# Patient Record
Sex: Male | Born: 1970 | Race: White | Hispanic: No | Marital: Married | Smoking: Former smoker
Health system: Southern US, Community
[De-identification: ages and names within clinical notes are randomized; demographics above are authoritative.]

## PROBLEM LIST (undated history)

## (undated) DIAGNOSIS — K900 Celiac disease: Secondary | ICD-10-CM

## (undated) DIAGNOSIS — E785 Hyperlipidemia, unspecified: Secondary | ICD-10-CM

## (undated) DIAGNOSIS — I341 Nonrheumatic mitral (valve) prolapse: Secondary | ICD-10-CM

## (undated) DIAGNOSIS — I10 Essential (primary) hypertension: Secondary | ICD-10-CM

## (undated) DIAGNOSIS — K219 Gastro-esophageal reflux disease without esophagitis: Secondary | ICD-10-CM

## (undated) HISTORY — DX: Essential (primary) hypertension: I10

## (undated) HISTORY — DX: Hyperlipidemia, unspecified: E78.5

## (undated) HISTORY — PX: SPINAL CORD STIMULATOR INSERTION: SHX5378

## (undated) HISTORY — PX: KNEE ARTHROSCOPY: SHX127

## (undated) HISTORY — PX: TONSILLECTOMY: SUR1361

---

## 1998-02-12 ENCOUNTER — Ambulatory Visit (HOSPITAL_COMMUNITY): Admission: RE | Admit: 1998-02-12 | Discharge: 1998-02-12 | Payer: Self-pay | Admitting: Neurology

## 1998-02-18 ENCOUNTER — Ambulatory Visit (HOSPITAL_COMMUNITY): Admission: RE | Admit: 1998-02-18 | Discharge: 1998-02-18 | Payer: Self-pay | Admitting: Neurology

## 1998-02-20 ENCOUNTER — Ambulatory Visit (HOSPITAL_COMMUNITY): Admission: RE | Admit: 1998-02-20 | Discharge: 1998-02-20 | Payer: Self-pay | Admitting: Neurology

## 1999-11-12 ENCOUNTER — Encounter: Payer: Self-pay | Admitting: Emergency Medicine

## 1999-11-12 ENCOUNTER — Emergency Department (HOSPITAL_COMMUNITY): Admission: EM | Admit: 1999-11-12 | Discharge: 1999-11-12 | Payer: Self-pay | Admitting: Emergency Medicine

## 2000-11-16 ENCOUNTER — Encounter: Payer: Self-pay | Admitting: Orthopedic Surgery

## 2000-11-16 ENCOUNTER — Ambulatory Visit (HOSPITAL_COMMUNITY): Admission: RE | Admit: 2000-11-16 | Discharge: 2000-11-16 | Payer: Self-pay | Admitting: Orthopedic Surgery

## 2001-09-08 ENCOUNTER — Encounter: Admission: RE | Admit: 2001-09-08 | Discharge: 2001-09-08 | Payer: Self-pay | Admitting: Urology

## 2001-09-08 ENCOUNTER — Encounter: Payer: Self-pay | Admitting: Urology

## 2004-06-09 ENCOUNTER — Emergency Department (HOSPITAL_COMMUNITY): Admission: EM | Admit: 2004-06-09 | Discharge: 2004-06-09 | Payer: Self-pay | Admitting: Emergency Medicine

## 2006-03-24 ENCOUNTER — Ambulatory Visit (HOSPITAL_BASED_OUTPATIENT_CLINIC_OR_DEPARTMENT_OTHER): Admission: RE | Admit: 2006-03-24 | Discharge: 2006-03-24 | Payer: Self-pay | Admitting: Orthopedic Surgery

## 2007-04-05 HISTORY — PX: BACK SURGERY: SHX140

## 2007-04-12 ENCOUNTER — Emergency Department (HOSPITAL_COMMUNITY): Admission: EM | Admit: 2007-04-12 | Discharge: 2007-04-12 | Payer: Self-pay | Admitting: Emergency Medicine

## 2007-05-02 ENCOUNTER — Encounter (INDEPENDENT_AMBULATORY_CARE_PROVIDER_SITE_OTHER): Payer: Self-pay | Admitting: Orthopedic Surgery

## 2007-05-02 ENCOUNTER — Inpatient Hospital Stay (HOSPITAL_COMMUNITY): Admission: RE | Admit: 2007-05-02 | Discharge: 2007-05-06 | Payer: Self-pay | Admitting: Orthopedic Surgery

## 2008-05-24 ENCOUNTER — Ambulatory Visit (HOSPITAL_BASED_OUTPATIENT_CLINIC_OR_DEPARTMENT_OTHER): Admission: RE | Admit: 2008-05-24 | Discharge: 2008-05-24 | Payer: Self-pay | Admitting: Family Medicine

## 2008-06-06 ENCOUNTER — Emergency Department (HOSPITAL_BASED_OUTPATIENT_CLINIC_OR_DEPARTMENT_OTHER): Admission: EM | Admit: 2008-06-06 | Discharge: 2008-06-06 | Payer: Self-pay | Admitting: Emergency Medicine

## 2008-12-18 ENCOUNTER — Ambulatory Visit: Payer: Self-pay | Admitting: Diagnostic Radiology

## 2008-12-18 ENCOUNTER — Emergency Department (HOSPITAL_BASED_OUTPATIENT_CLINIC_OR_DEPARTMENT_OTHER): Admission: EM | Admit: 2008-12-18 | Discharge: 2008-12-18 | Payer: Self-pay | Admitting: Emergency Medicine

## 2009-06-06 ENCOUNTER — Ambulatory Visit (HOSPITAL_COMMUNITY): Admission: RE | Admit: 2009-06-06 | Discharge: 2009-06-06 | Payer: Self-pay | Admitting: Gastroenterology

## 2009-07-14 ENCOUNTER — Encounter: Admission: RE | Admit: 2009-07-14 | Discharge: 2009-10-12 | Payer: Self-pay | Admitting: Gastroenterology

## 2009-09-03 ENCOUNTER — Encounter: Admission: RE | Admit: 2009-09-03 | Discharge: 2009-09-03 | Payer: Self-pay | Admitting: Orthopedic Surgery

## 2009-09-08 ENCOUNTER — Encounter: Admission: RE | Admit: 2009-09-08 | Discharge: 2009-09-08 | Payer: Self-pay | Admitting: Orthopedic Surgery

## 2010-07-26 ENCOUNTER — Encounter: Payer: Self-pay | Admitting: Orthopedic Surgery

## 2010-10-12 LAB — POCT CARDIAC MARKERS
CKMB, poc: <1 ng/mL — ABNORMAL LOW (ref 1.0–8.0)
CKMB, poc: <1 ng/mL — ABNORMAL LOW (ref 1.0–8.0)
Myoglobin, poc: 28.6 ng/mL (ref 12–200)
Myoglobin, poc: 29.5 ng/mL (ref 12–200)
Troponin i, poc: <0.05 ng/mL (ref 0.00–0.09)
Troponin i, poc: <0.05 ng/mL (ref 0.00–0.09)

## 2010-10-12 LAB — BASIC METABOLIC PANEL
BUN: 13 mg/dL (ref 6–23)
CO2: 30 mEq/L (ref 19–32)
Calcium: 8.6 mg/dL (ref 8.4–10.5)
Chloride: 104 mEq/L (ref 96–112)
Creatinine, Ser: 1 mg/dL (ref 0.4–1.5)
GFR calc Af Amer: >60 mL/min (ref >60)
GFR calc non Af Amer: >60 mL/min (ref >60)
Glucose, Bld: 94 mg/dL (ref 70–99)
Potassium: 3.8 mEq/L (ref 3.5–5.1)
Sodium: 142 mEq/L (ref 135–145)

## 2010-10-12 LAB — CBC
HCT: 39.1 % (ref 39.0–52.0)
Hemoglobin: 13 g/dL (ref 13.0–17.0)
MCHC: 33.3 g/dL (ref 30.0–36.0)
MCV: 85.8 fL (ref 78.0–100.0)
Platelets: 314 10*3/uL (ref 150–400)
RBC: 4.56 MIL/uL (ref 4.22–5.81)
RDW: 14.3 % (ref 11.5–15.5)
WBC: 9 10*3/uL (ref 4.0–10.5)

## 2010-11-17 NOTE — Discharge Summary (Signed)
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   HOSPITAL COURSE:  The patient was admitted and taken to the operating  room on the day of admission for management of axial pain and right leg  pain secondary to an L4-5 disk degeneration with annular tear, L5-S1  anomalous segmentation with disk degeneration.  On the day of admission,  he was taken to the operating room anterior antibody fusion was carried  out at L4-5 and L5-S1.  Postoperatively, in the recovery room he  complained of inability to move his left leg, decreased sensation in the  lower leg, and a heavy feeling in the leg.  He had markedly reduced  muscle strength in all muscle groups tested on manual resistance.  The  reason for this was completely unclear as there had been no  intraoperative complications which would cause this type of situation.  For this reason, he is taken from the recovery room to the radiology  department with Dr. Lacy Duverney, performed a myelogram with CT scan.  This  myelogram and CT scan were in fact normal.  There was no suggestion of  any underlying cause associated with nerve root compression etcetera.  Fortunately, over the course of approximately the next 36 hours,  symptoms virtually improved and resolved with full strength returning to  his lower extremity.   Otherwise, his course was uncomplicated.  He was able to void  voluntarily subsequent to the removal of the Foley catheter.  His ileus  resolved in a usual time frame and he was able to tolerate oral intake  satisfactorily.   At the time of discharge, he is ambulatory and he is using a walker for  longer distances.  His incisions looked fine this morning.  Dressings  had been removed.   DISCHARGE MEDICATIONS:  He is being given discharge medications,  prescriptions written by Lianne Cure, P.A.C., as follows:  1. Norco 10 one to two q.4 hours p.r.n., 120 tablets.  2. Robaxin 500 mg 1 to 2 q.6 hours for muscle spasm, 100 tablets.  3. Coumadin 3 mg, which he will initiate at 3 mg per day until further      notice.   He has been placed on prophylactic anticoagulation because of a good  deal of retraction to common iliac vessels during his surgery.  His INR  Pro Time will be checked twice a week until his Coumadin dose is felt to  be stable.   Today his INR/Pro Time result is pending.  If the result is such that I  feel his dose needs to be adjusted upward or downward from 3 mg, I have  his  phone number and will call him.   He is to return to the office in approximately 4 weeks time.  He is to  avoid bending and lifting.  His postoperative physical is walking.   CONDITION ON DISCHARGE:  Improved back pain, ambulatory.  Wound stable.   See above for discharge medications and instructions and followup.      Nelda Severe, MD  Electronically Signed     MT/MEDQ  D:  05/06/2007  T:  05/07/2007  Job:  [id number]

## 2010-11-17 NOTE — Op Note (Signed)
Paul Roman, Paul Roman               ACCOUNT NO.:  [id number]   MEDICAL RECORD NO.:  [id number]          PATIENT TYPE:  INP   LOCATION:  2550                         FACILITY:  [facility]   PHYSICIAN:  Nelda Severe, MD      DATE OF BIRTH:  February 25, 1971   DATE OF PROCEDURE:  DATE OF DISCHARGE:                               OPERATIVE REPORT   SURGEON:  Nelda Severe, MD.   ASSISTANT:  Lianne Cure, PA-C.   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   OPERATIVE PROCEDURE:  Anterior interbody fusion at L5-S1 (Titan cage  with autogenous bone graft), L4-5 anterior interbody fusion Synfix cage  and internal fixation device with autogenous iliac crest bone graft,  harvest left anterior iliac crest graft.   OPERATIVE NOTE:  The patient was placed under general endotracheal  anesthesia.  Prior to induction of anesthesia a central line and  arterial line had been started.  After anesthesia was induced, Foley  catheter was placed in the bladder.  Bilateral sequential compression  devices were placed on both lower extremities.  A pulse oximeter was  placed on the left great toe.   The patient was placed supine on the operating table.    Dictation ended at this point.      Nelda Severe, MD  Electronically Signed     MT/MEDQ  D:  05/02/2007  T:  05/02/2007  Job:  [id number]

## 2010-11-17 NOTE — Op Note (Signed)
NAMEKHADEN, GATER               ACCOUNT NO.:  [id number]   MEDICAL RECORD NO.:  [id number]          PATIENT TYPE:  INP   LOCATION:  2550                         FACILITY:  [facility]   PHYSICIAN:  Nelda Severe, MD      DATE OF BIRTH:  July 08, 1970   DATE OF PROCEDURE:  05/02/2007  DATE OF DISCHARGE:                               OPERATIVE REPORT   SURGEON:  Nelda Severe, M.D.   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   POSTOPERATIVE DIAGNOSIS:  L4-5 annular tear, L5-S1 anomalous  segmentation with disk degeneration.   OPERATIVE PROCEDURE:  Anterior interbody body fusions at L4-5 (Synthes  SynFix cage with internal fixation device and autogenous bone graft );  L5-S1 anterior interbody fusion (Titan interbody cage with autogenous  bone graft and doorstop screw); harvest left anterior iliac crest graft.   OPERATIVE NOTE:  In the preoperative area the patient had a central and  arterial line placed by the anesthesiologists.  He was then brought to  the operating room where general endotracheal anesthesia was induced.  A  Foley catheter was placed in the bladder.  A pulse oximeter device was  placed on the left great toe.  Sequential compression devices were  placed on both lower extremities.  Some hair was clipped from the  abdomen and suprapubic area.  The patient was positioned prone on a  Jackson table.  The arms were abducted at the sides on arm boards.  The  anterior abdominal area was prepped with DuraPrep and draped in  rectangular fashion.  The drapes were secured with Ioban.  The field  included the left anterior iliac crest area.   Once the time out had been performed we began the procedure by making  incision just distal to the anterior iliac crest on the left side  approximately 5 cm posterior to the anterior superior iliac spine.  Dissection was carried down through the external oblique fascia onto the  top of  the iliac crest which was exposed with elevators.   We then used a disposable bone graft harvesting device to remove several  cores of bone from the medullary cavity of the iliac crest.  More bone  was harvested with a curette.  The defect in the bone was then packed  with Gelfoam.   We then made a left lower abdominal paramedian incision.  The rectus  sheath was incised in line with the incision.  The medial edge of the  rectus was mobilized laterally.  The arcuate line was defined.  A sharp  scalpel was used to incise the posterior rectus sheath above the arcuate  line and then this was bluntly dissected off the peritoneum.  We then  bluntly dissected through the preperitoneal fat distal to the arcuate  line into the retroperitoneal space and mobilized the peritoneal sac  from left to right.  The psoas muscle, iliac vessels and ureter were all  identified.  The ureter was retracted to the right side with the  peritoneum.  The prominent disk was identified in the bifurcation  of the  great vessels and a crosstable lateral fluoro view taken which showed  this to be L4-5, and I had actually expected I was going to be at L5-S1.  We then further bluntly dissected distally, being careful to start the  dissection from the left side, working to the right to expose the  anterior annulus of L5-S1.  In the course of identifying this interval,  it became evident that the vascular was somewhat anomalous.  Firstly,  the bifurcation was very much higher than usual.  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  This vein was ligated with 3-0 silk and  divided.  This permitted further exposure of the anterior disk space at  L5-S1 which was confirmed with lateral fluoroscopic view.  Self-  retaining table mounted retractors were then placed to retract the  iliac  vessels bilaterally and a self-retaining retractor used to retract the  peritoneum proximally.  A small lap sponge was packed in the presacral  area and later removed.   We then did an extensive disk excision at L5-S1, removing approximately  90% of the disk tissue in the usual fashion.  First we fenestrated the  annulus and then separated the nucleus from the endplates of L5-S1 and  used a curette to further divide the tissue laterally on both sides.  Further preparation of the disk space was then carried out with special  broaches with the [facility] system.  We were actually able to broach up to  an 11 mm thickness, but with a small footprint.  I tried the next size  up footprint and it would not seat adequately.   Therefore, the small footprint 11 mm Titan implant was packed with  autogenous bone graft and then impacted into place under fluoroscopic  guidance.  A doorstop screw (30 mm cancellous screw with washer,  titanium) was inserted into S1 and checked for position with lateral  fluoroscopy.   I had initially thought that the common iliac vein's origin from the  vena cava was low enough that it would be impossible to expose the L4-5  disk (which we had identified initially) through the bifurcation.  I  therefore had developed the usual interval lateral to the common iliac  artery and vein and medial to the psoas muscle using blunt dissection.  We did identify and ligate and divide segmental vessels at the L4 level  above and iliofemoral tributary distally.  I then began mobilization of  the common iliac vein and got it past the midline, but did not seem to  move it any farther without placing undue tension on it.  I then exposed  it more medially in the bifurcation and bluntly dissected proximally  along the left common iliac vein to the bifurcation of the vena cava.  We then mobilized the common iliac vein on the right to the right side,  and on the left to the left side,  and this satisfactorily exposed the  disk.  Self-retaining table mounted retractors were then placed to hold  the veins and arteries.  During this time the pulse oximeter began to  alarm as is the usual case when the artery is retracted.  It was turned  off, but later turned on when retraction was released and oxygen  saturation did return to 100%.  We then prepared the disk at L4-5 in a similar fashion to that described  at L5-S1, except that this was for a SynFix implant.  This SynFix  implant was being used at L4-5, but not at L5-S1 because I felt that the  size of the implant was such that it was not going to fit well in the L5-  S1 disk.   Having enucleated the disk and separated the nucleus from the endplates,  and having curetted the endplates, smallest available implant sizer was  inserted at L4-5 and fit nicely.  We had to harvest a little more bone  graft to adequately pack the SynFix device.  This was easily done as I  had not closed the iliac crest wound and merely curetted a little more  bone from between the outer and inner tables of the ilium.  Having  packed the implant with autogenous bone from the iliac crest, it was  then inserted using the disk with squid inserter device.  Four screws  were then placed through the internal fixation device, 2 above into L4  and 2 below into L5.  We then took lateral fluoroscopic and AP  fluoroscopic views which were recorded and showed satisfactory position  of all implants.   The retractors were all removed prior to the taking of the fluoroscopic  views.  I then inspected the wound again, retracting the peritoneal sac  with my hand, and there was no bleeding identifiable.   The anterior rectus sheath was then closed using a continuous #1 Vicryl  suture.  The subcutaneous tissue was closed using 2-0 undyed Vicryl in  simple inverted sutures.  Skin layer was closed using subcuticular 3-0  undyed Vicryl in continuous fashion.  The  skin edges were reinforced  with Steri-Strips and a nonadherent dressing applied.  The iliac crest  wound was closed in similar fashion, using an O Vicryl in the external  oblique fascia, 2-0 Vicryl in the subcutaneous tissue and a subcuticular  3-0 undyed running suture in the skin.  Steri-Strips were applied and a  nonadherent dressing applied.   Blood loss estimated at 400 mL.  The patient returned to the recovery  room in satisfactory condition.  He was able to actively plantar flex  and dorsiflex both feet and ankles.  As noted oxygen saturation returned  to 100%.   There no intraoperative complications.  Sponge and needle counts were  correct.      Nelda Severe, MD  Electronically Signed     MT/MEDQ  D:  05/02/2007  T:  05/02/2007  Job:  [id number]

## 2011-04-01 ENCOUNTER — Emergency Department (INDEPENDENT_AMBULATORY_CARE_PROVIDER_SITE_OTHER): Payer: BC Managed Care – PPO

## 2011-04-01 ENCOUNTER — Encounter: Payer: Self-pay | Admitting: *Deleted

## 2011-04-01 ENCOUNTER — Emergency Department (HOSPITAL_BASED_OUTPATIENT_CLINIC_OR_DEPARTMENT_OTHER)
Admission: EM | Admit: 2011-04-01 | Discharge: 2011-04-01 | Disposition: A | Discharge status: Home / Self Care | Payer: BC Managed Care – PPO | Attending: Emergency Medicine | Admitting: Emergency Medicine

## 2011-04-01 DIAGNOSIS — R509 Fever, unspecified: Secondary | ICD-10-CM | POA: Insufficient documentation

## 2011-04-01 DIAGNOSIS — R52 Pain, unspecified: Secondary | ICD-10-CM

## 2011-04-01 DIAGNOSIS — R7989 Other specified abnormal findings of blood chemistry: Secondary | ICD-10-CM | POA: Insufficient documentation

## 2011-04-01 DIAGNOSIS — R1031 Right lower quadrant pain: Secondary | ICD-10-CM | POA: Insufficient documentation

## 2011-04-01 DIAGNOSIS — I708 Atherosclerosis of other arteries: Secondary | ICD-10-CM

## 2011-04-01 DIAGNOSIS — R319 Hematuria, unspecified: Secondary | ICD-10-CM | POA: Insufficient documentation

## 2011-04-01 DIAGNOSIS — R300 Dysuria: Secondary | ICD-10-CM | POA: Insufficient documentation

## 2011-04-01 DIAGNOSIS — R110 Nausea: Secondary | ICD-10-CM | POA: Insufficient documentation

## 2011-04-01 HISTORY — DX: Celiac disease: K90.0

## 2011-04-01 LAB — DIFFERENTIAL
Basophils Absolute: 0 10*3/uL (ref 0.0–0.1)
Basophils Relative: 0 % (ref 0–1)
Eosinophils Absolute: 0 10*3/uL (ref 0.0–0.7)
Eosinophils Relative: 0 % (ref 0–5)
Lymphocytes Relative: 9 % — ABNORMAL LOW (ref 12–46)
Lymphs Abs: 1.5 10*3/uL (ref 0.7–4.0)
Monocytes Absolute: 1.3 10*3/uL — ABNORMAL HIGH (ref 0.1–1.0)
Monocytes Relative: 8 % (ref 3–12)
Neutro Abs: 13.3 10*3/uL — ABNORMAL HIGH (ref 1.7–7.7)
Neutrophils Relative %: 83 % — ABNORMAL HIGH (ref 43–77)

## 2011-04-01 LAB — COMPREHENSIVE METABOLIC PANEL
ALT: 76 U/L — ABNORMAL HIGH (ref 0–53)
AST: 102 U/L — ABNORMAL HIGH (ref 0–37)
Albumin: 3.3 g/dL — ABNORMAL LOW (ref 3.5–5.2)
Alkaline Phosphatase: 98 U/L (ref 39–117)
BUN: 10 mg/dL (ref 6–23)
CO2: 25 mEq/L (ref 19–32)
Calcium: 8.9 mg/dL (ref 8.4–10.5)
Chloride: 98 mEq/L (ref 96–112)
Creatinine, Ser: 0.9 mg/dL (ref 0.50–1.35)
GFR calc Af Amer: >60 mL/min (ref >60)
GFR calc non Af Amer: >60 mL/min (ref >60)
Glucose, Bld: 124 mg/dL — ABNORMAL HIGH (ref 70–99)
Potassium: 3.9 mEq/L (ref 3.5–5.1)
Sodium: 133 mEq/L — ABNORMAL LOW (ref 135–145)
Total Bilirubin: 0.5 mg/dL (ref 0.3–1.2)
Total Protein: 7 g/dL (ref 6.0–8.3)

## 2011-04-01 LAB — CBC
HCT: 42.4 % (ref 39.0–52.0)
Hemoglobin: 14.2 g/dL (ref 13.0–17.0)
MCH: 29.6 pg (ref 26.0–34.0)
MCHC: 33.5 g/dL (ref 30.0–36.0)
MCV: 88.5 fL (ref 78.0–100.0)
Platelets: 224 10*3/uL (ref 150–400)
RBC: 4.79 MIL/uL (ref 4.22–5.81)
RDW: 12.4 % (ref 11.5–15.5)
WBC: 16 10*3/uL — ABNORMAL HIGH (ref 4.0–10.5)

## 2011-04-01 LAB — URINALYSIS, ROUTINE W REFLEX MICROSCOPIC
Bilirubin Urine: NEGATIVE
Glucose, UA: NEGATIVE mg/dL
Ketones, ur: NEGATIVE mg/dL
Leukocytes, UA: NEGATIVE
Nitrite: NEGATIVE
Protein, ur: 30 mg/dL — AB
Specific Gravity, Urine: 1.018 (ref 1.005–1.030)
Urobilinogen, UA: 1 mg/dL (ref 0.0–1.0)
pH: 6 (ref 5.0–8.0)

## 2011-04-01 LAB — URINE MICROSCOPIC-ADD ON

## 2011-04-01 LAB — RAPID STREP SCREEN (MED CTR MEBANE ONLY): Streptococcus, Group A Screen (Direct): NEGATIVE

## 2011-04-01 MED ORDER — SODIUM CHLORIDE 0.9 % IV BOLUS (SEPSIS)
1000.0000 mL | Freq: Once | INTRAVENOUS | Status: AC
  Administered 2011-04-01: 1000 mL via INTRAVENOUS

## 2011-04-01 MED ORDER — IOHEXOL 300 MG/ML  SOLN
100.0000 mL | Freq: Once | INTRAMUSCULAR | Status: AC | PRN
  Administered 2011-04-01: 100 mL via INTRAVENOUS

## 2011-04-01 NOTE — ED Provider Notes (Signed)
History     CSN: [id number] Arrival date & time: 04/01/2011  1:46 PM  Chief Complaint  Patient presents with  . Generalized Body Aches    (Consider location/radiation/quality/duration/timing/severity/associated sxs/prior treatment) HPI Comments: Pt state that he is just feeling bad over the last couple days, fever, nausea, 2 episodes of vomiting, headache sorethroat, cough and hematuria:pt states that he was seen by his doctor yesterday and diagnosed with prostatitis:pt states that he has had 2 doses of medication and he is not feeling any better and when he called the office he was told to come to the er to be evaluated  Patient is a 40 y.o. male presenting with dysuria. The history is provided by the patient. No language interpreter was used.  Dysuria  This is a new problem. The current episode started 2 days ago. The problem occurs every urination. The pain is moderate. The maximum temperature recorded prior to his arrival was 101 to 101.9 F. The fever has been present for 1 to 2 days. He is sexually active. Associated symptoms include nausea and hematuria. He has tried antibiotics for the symptoms.    Past Medical History  Diagnosis Date  . Celiac disease     Past Surgical History  Procedure Date  . Back surgery   . Knee arthroscopy     No family history on file.  History  Substance Use Topics  . Smoking status: Not on file  . Smokeless tobacco: Not on file  . Alcohol Use:       Review of Systems  Gastrointestinal: Positive for nausea.  Genitourinary: Positive for dysuria and hematuria.  All other systems reviewed and are negative.    Allergies  Penicillins; Sulfa antibiotics; and Singulair  Home Medications   Current Outpatient Rx  Name Route Sig Dispense Refill  . ALPRAZOLAM PO Oral Take by mouth.      Marland Kitchen CIPRO PO Oral Take by mouth.      . OXYCONTIN PO Oral Take by mouth.      . OXYCODONE HCL PO Oral Take by mouth.        BP 125/81  Pulse 89   Temp(Src) 99.8 F (37.7 C) (Oral)  Resp 18  Ht 5\' 11"  (1.803 m)  Wt 211 lb (95.709 kg)  BMI 29.43 kg/m2  SpO2 95%  Physical Exam  Nursing note and vitals reviewed. Constitutional: He is oriented to person, place, and time. He appears well-developed and well-nourished.  HENT:  Right Ear: External ear normal.  Left Ear: External ear normal.  Mouth/Throat: Oropharynx is clear and moist.  Neck: Normal range of motion. Neck supple. No Brudzinski's sign and no Kernig's sign noted.  Cardiovascular: Normal rate and regular rhythm.   Pulmonary/Chest: Effort normal and breath sounds normal.  Abdominal: Soft. Bowel sounds are normal.  Musculoskeletal: Normal range of motion.  Neurological: He is alert and oriented to person, place, and time.  Skin: Skin is warm and dry. No rash noted.  Psychiatric: He has a normal mood and affect.    ED Course  Procedures (including critical care time)  Labs Reviewed  URINALYSIS, ROUTINE W REFLEX MICROSCOPIC - Abnormal; Notable for the following:    Hgb urine dipstick TRACE (*)    Protein, ur 30 (*)    All other components within normal limits  COMPREHENSIVE METABOLIC PANEL - Abnormal; Notable for the following:    Sodium 133 (*)    Glucose, Bld 124 (*)    Albumin 3.3 (*)    AST  102 (*)    ALT 76 (*)    All other components within normal limits  CBC - Abnormal; Notable for the following:    WBC 16.0 (*)    All other components within normal limits  DIFFERENTIAL - Abnormal; Notable for the following:    Neutrophils Relative 83 (*)    Neutro Abs 13.3 (*)    Lymphocytes Relative 9 (*)    Monocytes Absolute 1.3 (*)    All other components within normal limits  URINE MICROSCOPIC-ADD ON - Abnormal; Notable for the following:    Bacteria, UA FEW (*)    All other components within normal limits  RAPID STREP SCREEN   Dg Chest 2 View  04/01/2011  *RADIOLOGY REPORT*  Clinical Data: Fever, body aches, vomiting  CHEST - 2 VIEW  Comparison: Chest x-ray  of 12/18/2008  Findings: The lungs are clear.  Mediastinal contours appear normal. The heart is within normal limits in size.  No bony abnormality is seen.  IMPRESSION: No active lung disease.  Original Report Authenticated By: Juline Patch, M.D.   Ct Abdomen Pelvis W Contrast  04/01/2011  *RADIOLOGY REPORT*  Clinical Data: Right lower quadrant abdominal pain.  Fever. History of celiac disease.  CT ABDOMEN AND PELVIS WITH CONTRAST 04/01/2011:  Technique:  Multidetector CT imaging of the abdomen and pelvis was performed following the standard protocol during bolus administration of intravenous contrast.  Contrast: OMNIPAQUE IOHEXOL 300 MG/ML IV.  Oral contrast was also administered.  Comparison: CT abdomen and pelvis 05/24/2008.  Findings: Geographic areas of severe hepatic steatosis, with other geographic areas of sparing; no significant focal hepatic parenchymal abnormality otherwise.  Normal appearing spleen, pancreas, adrenal glands, and kidneys.  Gallbladder unremarkable by CT.  No biliary ductal dilation.  Mild bilateral iliac artery atherosclerosis, unchanged.  Visceral arteries patent.  No significant lymphadenopathy.  Stomach, small bowel, and colon normal in appearance.  Normal appendix in the right upper pelvis.  No ascites.  Urinary bladder unremarkable.  Prostate gland and seminal vesicles normal for age.  Visualized lung bases clear.  Heart size normal.  Bone window images demonstrate prior L4-5 and L5-S1 anterior fusion without complicating features.  IMPRESSION:  1.  No acute abnormalities involving the abdomen or pelvis. 2.  Geographic areas of focal hepatic steatosis, with other geographic areas of sparing. 3.  Mild bilateral common iliac artery atherosclerosis, perhaps advanced for age.  Original Report Authenticated By: Arnell Sieving, M.D.     No diagnosis found.    MDM  No acute finding:pt has no meningeal signs at this time:pt is feeling slightly better with fluids:will  have pt continue cipro:discussed elevated lft with pt:pt to follow up with pcp tomorrow        Teressa Lower, NP 04/01/11 1721

## 2011-04-01 NOTE — ED Notes (Signed)
Pt. Was seen at [facility] at [place] on yesterday for prostate infection??? And today Pt. Reports feeling tired and weak with fever.  Pt. Reports he was urinating blood yesterday.  Pt. Reports the NP did a prostate exam on yesterday but Pt. Could hardly stand the exam.

## 2011-04-01 NOTE — ED Notes (Signed)
Pt. In no distress.

## 2011-04-02 NOTE — ED Provider Notes (Signed)
Medical screening examination/treatment/procedure(s) were performed by non-physician practitioner and as supervising physician I was immediately available for consultation/collaboration.   Dayton Bailiff, MD 04/02/11 1535

## 2011-04-09 LAB — DIFFERENTIAL
Basophils Absolute: 0 10*3/uL (ref 0.0–0.1)
Basophils Relative: 0 % (ref 0–1)
Eosinophils Absolute: 0.1 10*3/uL (ref 0.0–0.7)
Eosinophils Relative: 1 % (ref 0–5)
Lymphocytes Relative: 23 % (ref 12–46)
Lymphs Abs: 2.1 10*3/uL (ref 0.7–4.0)
Monocytes Absolute: 0.8 10*3/uL (ref 0.1–1.0)
Monocytes Relative: 9 % (ref 3–12)
Neutro Abs: 6.2 10*3/uL (ref 1.7–7.7)
Neutrophils Relative %: 67 % (ref 43–77)

## 2011-04-09 LAB — CBC
HCT: 38.5 % — ABNORMAL LOW (ref 39.0–52.0)
Hemoglobin: 12 g/dL — ABNORMAL LOW (ref 13.0–17.0)
MCHC: 31.2 g/dL (ref 30.0–36.0)
MCV: 76 fL — ABNORMAL LOW (ref 78.0–100.0)
Platelets: 385 10*3/uL (ref 150–400)
RBC: 5.07 MIL/uL (ref 4.22–5.81)
RDW: 15.4 % (ref 11.5–15.5)
WBC: 9.2 10*3/uL (ref 4.0–10.5)

## 2011-04-09 LAB — URINALYSIS, ROUTINE W REFLEX MICROSCOPIC
Bilirubin Urine: NEGATIVE
Glucose, UA: NEGATIVE mg/dL
Hgb urine dipstick: NEGATIVE
Ketones, ur: NEGATIVE mg/dL
Nitrite: NEGATIVE
Protein, ur: NEGATIVE mg/dL
Specific Gravity, Urine: 1.012 (ref 1.005–1.030)
Urobilinogen, UA: 0.2 mg/dL (ref 0.0–1.0)
pH: 6 (ref 5.0–8.0)

## 2011-04-09 LAB — BASIC METABOLIC PANEL
BUN: 6 mg/dL (ref 6–23)
CO2: 24 mEq/L (ref 19–32)
Calcium: 8.5 mg/dL (ref 8.4–10.5)
Chloride: 108 mEq/L (ref 96–112)
Creatinine, Ser: 0.7 mg/dL (ref 0.4–1.5)
GFR calc Af Amer: >60 mL/min (ref >60)
GFR calc non Af Amer: >60 mL/min (ref >60)
Glucose, Bld: 93 mg/dL (ref 70–99)
Potassium: 4.1 mEq/L (ref 3.5–5.1)
Sodium: 141 mEq/L (ref 135–145)

## 2011-04-09 LAB — PROTIME-INR
INR: 1.1 (ref 0.00–1.49)
Prothrombin Time: 15 seconds (ref 11.6–15.2)

## 2011-04-09 LAB — OCCULT BLOOD X 1 CARD TO LAB, STOOL: Fecal Occult Bld: NEGATIVE

## 2011-04-13 LAB — BASIC METABOLIC PANEL
BUN: 10
CO2: 28
Calcium: 8.8
Chloride: 102
Creatinine, Ser: 0.85
GFR calc Af Amer: >60
GFR calc non Af Amer: >60
Glucose, Bld: 94
Potassium: 3.8
Sodium: 138

## 2011-04-13 LAB — CBC
HCT: 29.3 — ABNORMAL LOW
Hemoglobin: 9.6 — ABNORMAL LOW
MCHC: 32.9
MCV: 80.5
Platelets: 438 — ABNORMAL HIGH
RBC: 3.63 — ABNORMAL LOW
RDW: 16.8 — ABNORMAL HIGH
WBC: 6.9

## 2011-04-13 LAB — PROTIME-INR
INR: 2.5 — ABNORMAL HIGH
Prothrombin Time: 27.6 — ABNORMAL HIGH

## 2011-04-14 LAB — PROTIME-INR
INR: 0.9
INR: 1.1
INR: 2.1 — ABNORMAL HIGH
INR: 3.1 — ABNORMAL HIGH
Prothrombin Time: 12.8
Prothrombin Time: 14.6
Prothrombin Time: 24.4 — ABNORMAL HIGH
Prothrombin Time: 33.4 — ABNORMAL HIGH

## 2011-04-14 LAB — BASIC METABOLIC PANEL
BUN: 12
BUN: 4 — ABNORMAL LOW
BUN: 4 — ABNORMAL LOW
BUN: 7
CO2: 28
CO2: 29
CO2: 30
CO2: 31
Calcium: 8.1 — ABNORMAL LOW
Calcium: 8.3 — ABNORMAL LOW
Calcium: 8.4
Calcium: 9.3
Chloride: 100
Chloride: 101
Chloride: 104
Chloride: 104
Creatinine, Ser: 0.81
Creatinine, Ser: 0.82
Creatinine, Ser: 0.91
Creatinine, Ser: 0.92
GFR calc Af Amer: >60
GFR calc Af Amer: >60
GFR calc Af Amer: >60
GFR calc Af Amer: >60
GFR calc non Af Amer: >60
GFR calc non Af Amer: >60
GFR calc non Af Amer: >60
GFR calc non Af Amer: >60
Glucose, Bld: 110 — ABNORMAL HIGH
Glucose, Bld: 114 — ABNORMAL HIGH
Glucose, Bld: 90
Glucose, Bld: 99
Potassium: 3.9
Potassium: 3.9
Potassium: 4.2
Potassium: 4.4
Sodium: 137
Sodium: 137
Sodium: 137
Sodium: 140

## 2011-04-14 LAB — URINE CULTURE
Colony Count: NO GROWTH
Culture: NO GROWTH

## 2011-04-14 LAB — CBC
HCT: 27.1 — ABNORMAL LOW
HCT: 28.3 — ABNORMAL LOW
HCT: 29.8 — ABNORMAL LOW
HCT: 38.5 — ABNORMAL LOW
Hemoglobin: 12.4 — ABNORMAL LOW
Hemoglobin: 8.9 — ABNORMAL LOW
Hemoglobin: 9.3 — ABNORMAL LOW
Hemoglobin: 9.6 — ABNORMAL LOW
MCHC: 32.2
MCHC: 32.3
MCHC: 32.8
MCHC: 32.8
MCV: 79.9
MCV: 80.3
MCV: 80.3
MCV: 80.6
Platelets: 287
Platelets: 296
Platelets: 303
Platelets: 399
RBC: 3.36 — ABNORMAL LOW
RBC: 3.54 — ABNORMAL LOW
RBC: 3.72 — ABNORMAL LOW
RBC: 4.79
RDW: 16.7 — ABNORMAL HIGH
RDW: 16.9 — ABNORMAL HIGH
RDW: 16.9 — ABNORMAL HIGH
RDW: 16.9 — ABNORMAL HIGH
WBC: 7.7
WBC: 7.9
WBC: 8.9
WBC: 9.8

## 2011-04-14 LAB — TYPE AND SCREEN
ABO/RH(D): B POS
Antibody Screen: NEGATIVE

## 2011-04-14 LAB — DIFFERENTIAL
Basophils Absolute: 0
Basophils Relative: 0
Eosinophils Absolute: 0.1
Eosinophils Relative: 1
Lymphocytes Relative: 26
Lymphs Abs: 2
Monocytes Absolute: 0.7
Monocytes Relative: 8
Neutro Abs: 5
Neutrophils Relative %: 64

## 2011-04-14 LAB — URINALYSIS, ROUTINE W REFLEX MICROSCOPIC
Bilirubin Urine: NEGATIVE
Glucose, UA: NEGATIVE
Hgb urine dipstick: NEGATIVE
Ketones, ur: NEGATIVE
Nitrite: NEGATIVE
Protein, ur: NEGATIVE
Specific Gravity, Urine: 1.021
Urobilinogen, UA: 0.2
pH: 6

## 2011-04-14 LAB — ABO/RH: ABO/RH(D): B POS

## 2011-04-14 LAB — APTT: aPTT: 26

## 2011-04-15 LAB — CBC
HCT: 37.6 — ABNORMAL LOW
Hemoglobin: 12.1 — ABNORMAL LOW
MCHC: 32.2
MCV: 78.6
Platelets: 428 — ABNORMAL HIGH
RBC: 4.78
RDW: 15.9 — ABNORMAL HIGH
WBC: 8.4

## 2011-04-15 LAB — DIFFERENTIAL
Basophils Absolute: 0
Basophils Relative: 0
Eosinophils Absolute: 0.1
Eosinophils Relative: 1
Lymphocytes Relative: 20
Lymphs Abs: 1.7
Monocytes Absolute: 0.6
Monocytes Relative: 7
Neutro Abs: 6
Neutrophils Relative %: 72

## 2011-04-15 LAB — BASIC METABOLIC PANEL
BUN: 11
CO2: 25
Calcium: 8.6
Chloride: 105
Creatinine, Ser: 0.95
GFR calc Af Amer: >60
GFR calc non Af Amer: >60
Glucose, Bld: 99
Potassium: 4.3
Sodium: 136

## 2011-04-15 LAB — URINALYSIS, ROUTINE W REFLEX MICROSCOPIC
Bilirubin Urine: NEGATIVE
Glucose, UA: NEGATIVE
Hgb urine dipstick: NEGATIVE
Ketones, ur: NEGATIVE
Nitrite: NEGATIVE
Protein, ur: NEGATIVE
Specific Gravity, Urine: 1.018
Urobilinogen, UA: 0.2
pH: 5.5

## 2011-04-15 LAB — SEDIMENTATION RATE: Sed Rate: 18 — ABNORMAL HIGH

## 2011-04-15 LAB — URINE CULTURE
Colony Count: NO GROWTH
Culture: NO GROWTH

## 2011-06-21 ENCOUNTER — Other Ambulatory Visit: Payer: Self-pay | Admitting: Orthopedic Surgery

## 2011-06-22 ENCOUNTER — Encounter (HOSPITAL_COMMUNITY): Payer: Self-pay

## 2011-06-22 ENCOUNTER — Ambulatory Visit (HOSPITAL_COMMUNITY)
Admission: RE | Admit: 2011-06-22 | Discharge: 2011-06-22 | Disposition: A | Discharge status: Home / Self Care | Payer: BC Managed Care – PPO | Source: Ambulatory Visit | Attending: Orthopedic Surgery | Admitting: Orthopedic Surgery

## 2011-06-22 ENCOUNTER — Other Ambulatory Visit: Payer: Self-pay

## 2011-06-22 ENCOUNTER — Encounter (HOSPITAL_COMMUNITY)
Admission: RE | Admit: 2011-06-22 | Discharge: 2011-06-22 | Disposition: A | Discharge status: Home / Self Care | Payer: BC Managed Care – PPO | Source: Ambulatory Visit | Attending: Orthopedic Surgery | Admitting: Orthopedic Surgery

## 2011-06-22 DIAGNOSIS — Z01810 Encounter for preprocedural cardiovascular examination: Secondary | ICD-10-CM | POA: Insufficient documentation

## 2011-06-22 DIAGNOSIS — IMO0002 Reserved for concepts with insufficient information to code with codable children: Secondary | ICD-10-CM | POA: Insufficient documentation

## 2011-06-22 DIAGNOSIS — M1710 Unilateral primary osteoarthritis, unspecified knee: Secondary | ICD-10-CM | POA: Insufficient documentation

## 2011-06-22 DIAGNOSIS — Z01818 Encounter for other preprocedural examination: Secondary | ICD-10-CM | POA: Insufficient documentation

## 2011-06-22 DIAGNOSIS — Z87891 Personal history of nicotine dependence: Secondary | ICD-10-CM | POA: Insufficient documentation

## 2011-06-22 DIAGNOSIS — Z01812 Encounter for preprocedural laboratory examination: Secondary | ICD-10-CM | POA: Insufficient documentation

## 2011-06-22 DIAGNOSIS — Z01811 Encounter for preprocedural respiratory examination: Secondary | ICD-10-CM | POA: Insufficient documentation

## 2011-06-22 HISTORY — DX: Gastro-esophageal reflux disease without esophagitis: K21.9

## 2011-06-22 HISTORY — DX: Nonrheumatic mitral (valve) prolapse: I34.1

## 2011-06-22 LAB — DIFFERENTIAL
Basophils Absolute: 0.1 10*3/uL (ref 0.0–0.1)
Basophils Relative: 1 % (ref 0–1)
Eosinophils Absolute: 0.2 10*3/uL (ref 0.0–0.7)
Eosinophils Relative: 2 % (ref 0–5)
Lymphocytes Relative: 31 % (ref 12–46)
Lymphs Abs: 3.1 10*3/uL (ref 0.7–4.0)
Monocytes Absolute: 0.7 10*3/uL (ref 0.1–1.0)
Monocytes Relative: 7 % (ref 3–12)
Neutro Abs: 5.8 10*3/uL (ref 1.7–7.7)
Neutrophils Relative %: 59 % (ref 43–77)

## 2011-06-22 LAB — URINALYSIS, ROUTINE W REFLEX MICROSCOPIC
Bilirubin Urine: NEGATIVE
Glucose, UA: NEGATIVE mg/dL
Hgb urine dipstick: NEGATIVE
Ketones, ur: NEGATIVE mg/dL
Leukocytes, UA: NEGATIVE
Nitrite: NEGATIVE
Protein, ur: NEGATIVE mg/dL
Specific Gravity, Urine: 1.016 (ref 1.005–1.030)
Urobilinogen, UA: 0.2 mg/dL (ref 0.0–1.0)
pH: 6 (ref 5.0–8.0)

## 2011-06-22 LAB — CBC
HCT: 46.1 % (ref 39.0–52.0)
Hemoglobin: 15.7 g/dL (ref 13.0–17.0)
MCH: 30.3 pg (ref 26.0–34.0)
MCHC: 34.1 g/dL (ref 30.0–36.0)
MCV: 88.8 fL (ref 78.0–100.0)
Platelets: 351 10*3/uL (ref 150–400)
RBC: 5.19 MIL/uL (ref 4.22–5.81)
RDW: 12.9 % (ref 11.5–15.5)
WBC: 9.8 10*3/uL (ref 4.0–10.5)

## 2011-06-22 LAB — COMPREHENSIVE METABOLIC PANEL
ALT: 97 U/L — ABNORMAL HIGH (ref 0–53)
AST: 54 U/L — ABNORMAL HIGH (ref 0–37)
Albumin: 4 g/dL (ref 3.5–5.2)
Alkaline Phosphatase: 110 U/L (ref 39–117)
BUN: 9 mg/dL (ref 6–23)
CO2: 27 mEq/L (ref 19–32)
Calcium: 9.6 mg/dL (ref 8.4–10.5)
Chloride: 100 mEq/L (ref 96–112)
Creatinine, Ser: 1 mg/dL (ref 0.50–1.35)
GFR calc Af Amer: >90 mL/min (ref >90)
GFR calc non Af Amer: >90 mL/min (ref >90)
Glucose, Bld: 97 mg/dL (ref 70–99)
Potassium: 4.3 mEq/L (ref 3.5–5.1)
Sodium: 138 mEq/L (ref 135–145)
Total Bilirubin: 0.6 mg/dL (ref 0.3–1.2)
Total Protein: 7.8 g/dL (ref 6.0–8.3)

## 2011-06-22 LAB — SURGICAL PCR SCREEN
MRSA, PCR: NEGATIVE
Staphylococcus aureus: POSITIVE — AB

## 2011-06-22 LAB — APTT: aPTT: 27 seconds (ref 24–37)

## 2011-06-22 LAB — TYPE AND SCREEN
ABO/RH(D): B POS
Antibody Screen: NEGATIVE

## 2011-06-22 LAB — PROTIME-INR
INR: 0.98 (ref 0.00–1.49)
Prothrombin Time: 13.2 seconds (ref 11.6–15.2)

## 2011-06-22 MED ORDER — CHLORHEXIDINE GLUCONATE 4 % EX LIQD: 60.0000 mL | Freq: Once | CUTANEOUS | Status: DC

## 2011-06-22 NOTE — Pre-Procedure Instructions (Signed)
20 Paul Roman  06/22/2011   Your procedure is scheduled on:  Jun 25, 2011 Friday  Report to [facility] at 1100 AM.  Call this number if you have problems the morning of surgery: [phone]   Remember:   Do not eat food:After Midnight.  May have clear liquids: up to 4 Hours before arrival.  Clear liquids include soda, tea, black coffee, apple or grape juice, broth.  Take these medicines the morning of surgery with A SIP OF WATER: nexium,inhaler,xanax, oxycodone   Do not wear jewelry, make-up or nail polish.  Do not wear lotions, powders, or perfumes. You may wear deodorant.  Do not shave 48 hours prior to surgery.  Do not bring valuables to the hospital.  Contacts, dentures or bridgework may not be worn into surgery.  Leave suitcase in the car. After surgery it may be brought to your room.  For patients admitted to the hospital, checkout time is 11:00 AM the day of discharge.   Patients discharged the day of surgery will not be allowed to drive home.  Name and phone number of your driver: NA  Special Instructions: CHG Shower Use Special Wash: 1/2 bottle night before surgery and 1/2 bottle morning of surgery.   Please read over the following fact sheets that you were given: Pain Booklet, Blood Transfusion Information and Surgical Site Infection Prevention

## 2011-06-23 ENCOUNTER — Encounter (HOSPITAL_COMMUNITY): Payer: Self-pay | Admitting: Vascular Surgery

## 2011-06-23 NOTE — Consult Note (Signed)
Anesthesia:  Patient is a 40 year old male scheduled for a right TKA on 06/25/11.  His history includes celiac disease, MVP, asthma, GERD, knee arthroscopy '07, L5-S1 fusion '08, and former smoker.  Preoperative CXR and EKG noted.  I was asked to review his labs which show an elevated AST of 54 and ALT of 97.  His prior liver studies on 04/01/11 showed an AST of 102 and ALT of 76 (done during an ED visit for N/V, URI and urinary symptoms with treatment for prostatitis).  A CT scan of the abdomen and pelvis was done showing:  IMPRESSION: 1. No acute abnormalities involving the abdomen or pelvis. 2. Geographic areas of focal hepatic steatosis, with other geographic areas of sparing. 3. Mild bilateral common iliac artery atherosclerosis, perhaps advanced for age.  His coags and PLT count are WNL.  Since he has had previously elevated LFTs and current elevation is less than 2X normal, I will not plan on repeating them preoperatively.

## 2011-06-25 ENCOUNTER — Encounter (HOSPITAL_COMMUNITY): Admission: RE | Payer: Self-pay | Source: Ambulatory Visit

## 2011-06-25 ENCOUNTER — Inpatient Hospital Stay (HOSPITAL_COMMUNITY)
Admission: RE | Admit: 2011-06-25 | Payer: BC Managed Care – PPO | Source: Ambulatory Visit | Admitting: Orthopedic Surgery

## 2011-06-25 SURGERY — ARTHROPLASTY, KNEE, TOTAL
Anesthesia: Regional | Laterality: Right

## 2011-07-01 NOTE — Anesthesia Postprocedure Evaluation (Signed)
Case Cancelled

## 2011-07-01 NOTE — Transfer of Care (Signed)
Case Cancelled

## 2011-08-28 ENCOUNTER — Encounter (HOSPITAL_COMMUNITY): Payer: Self-pay | Admitting: *Deleted

## 2011-08-28 ENCOUNTER — Emergency Department (HOSPITAL_COMMUNITY)
Admission: EM | Admit: 2011-08-28 | Discharge: 2011-08-28 | Disposition: A | Discharge status: Home / Self Care | Payer: BC Managed Care – PPO | Attending: Emergency Medicine | Admitting: Emergency Medicine

## 2011-08-28 DIAGNOSIS — K219 Gastro-esophageal reflux disease without esophagitis: Secondary | ICD-10-CM | POA: Insufficient documentation

## 2011-08-28 DIAGNOSIS — E860 Dehydration: Secondary | ICD-10-CM

## 2011-08-28 DIAGNOSIS — F1120 Opioid dependence, uncomplicated: Secondary | ICD-10-CM | POA: Insufficient documentation

## 2011-08-28 DIAGNOSIS — J45909 Unspecified asthma, uncomplicated: Secondary | ICD-10-CM | POA: Insufficient documentation

## 2011-08-28 DIAGNOSIS — Z79899 Other long term (current) drug therapy: Secondary | ICD-10-CM | POA: Insufficient documentation

## 2011-08-28 DIAGNOSIS — F1193 Opioid use, unspecified with withdrawal: Secondary | ICD-10-CM

## 2011-08-28 DIAGNOSIS — F19939 Other psychoactive substance use, unspecified with withdrawal, unspecified: Secondary | ICD-10-CM | POA: Insufficient documentation

## 2011-08-28 DIAGNOSIS — K900 Celiac disease: Secondary | ICD-10-CM | POA: Insufficient documentation

## 2011-08-28 DIAGNOSIS — F1123 Opioid dependence with withdrawal: Secondary | ICD-10-CM

## 2011-08-28 DIAGNOSIS — F19239 Other psychoactive substance dependence with withdrawal, unspecified: Secondary | ICD-10-CM | POA: Insufficient documentation

## 2011-08-28 LAB — BASIC METABOLIC PANEL
BUN: 13 mg/dL (ref 6–23)
CO2: 29 mEq/L (ref 19–32)
Calcium: 9.5 mg/dL (ref 8.4–10.5)
Chloride: 98 mEq/L (ref 96–112)
Creatinine, Ser: 0.97 mg/dL (ref 0.50–1.35)
GFR calc Af Amer: >90 mL/min (ref >90)
GFR calc non Af Amer: >90 mL/min (ref >90)
Glucose, Bld: 129 mg/dL — ABNORMAL HIGH (ref 70–99)
Potassium: 3.6 mEq/L (ref 3.5–5.1)
Sodium: 137 mEq/L (ref 135–145)

## 2011-08-28 LAB — CBC
HCT: 46.2 % (ref 39.0–52.0)
Hemoglobin: 15.7 g/dL (ref 13.0–17.0)
MCH: 30.3 pg (ref 26.0–34.0)
MCHC: 34 g/dL (ref 30.0–36.0)
MCV: 89.2 fL (ref 78.0–100.0)
Platelets: 306 10*3/uL (ref 150–400)
RBC: 5.18 MIL/uL (ref 4.22–5.81)
RDW: 12.9 % (ref 11.5–15.5)
WBC: 11.3 10*3/uL — ABNORMAL HIGH (ref 4.0–10.5)

## 2011-08-28 MED ORDER — SODIUM CHLORIDE 0.9 % IV BOLUS (SEPSIS)
1000.0000 mL | Freq: Once | INTRAVENOUS | Status: AC
  Administered 2011-08-28: 1000 mL via INTRAVENOUS

## 2011-08-28 MED ORDER — PROMETHAZINE HCL 25 MG RE SUPP: 25.0000 mg | Freq: Four times a day (QID) | RECTAL | Status: DC | PRN

## 2011-08-28 MED ORDER — ONDANSETRON HCL 4 MG/2ML IJ SOLN
INTRAMUSCULAR | Status: AC
  Administered 2011-08-28: 4 mg via INTRAVENOUS
  Filled 2011-08-28: qty 2

## 2011-08-28 MED ORDER — ZOLPIDEM TARTRATE 5 MG PO TABS: 5.0000 mg | ORAL_TABLET | Freq: Every evening | ORAL | Status: DC | PRN

## 2011-08-28 MED ORDER — SODIUM CHLORIDE 0.9 % IV SOLN
Freq: Once | INTRAVENOUS | Status: AC
  Administered 2011-08-28: 17:00:00 via INTRAVENOUS

## 2011-08-28 MED ORDER — ONDANSETRON HCL 4 MG/2ML IJ SOLN
4.0000 mg | Freq: Once | INTRAMUSCULAR | Status: AC
  Administered 2011-08-28: 4 mg via INTRAVENOUS

## 2011-08-28 NOTE — ED Notes (Signed)
Pt states starting to feel nauseated.  Holding on cola for now.

## 2011-08-28 NOTE — ED Notes (Signed)
Pt given coke cola and tolerating well. States is feeling better. No distress or c/o.

## 2011-08-28 NOTE — Discharge Instructions (Signed)
Narcotic Withdrawal  When drug use interferes with normal living activities and relationships, it is abuse. Abuse includes problems with family and friends. Psychological dependence has developed when your mind tells you that the drug is needed. This is usually followed by a physical dependence in which you need more of the drug to get the same feeling or "high." This is known as addiction or chemical dependency. Risk is greater when chemical dependency exists in the family.  SYMPTOMS   When tolerance to narcotics has developed, stopping of the narcotic suddenly can cause uncomfortable physical symptoms. Most of the time these are mild and consist of shakes or jitters (tremors) in the hands,a rapid heart rate, rapid breathing, and temperature. Sometimes these symptoms are associated with anxiety, panic attacks, and bad dreams. Other symptoms include:   Irritability.   Anxiety.   Runny nose.   "Goose flesh."   Diarrhea.   Feeling sick to the stomach (nauseous).   Muscle spasms.   Sleeplessness.   Chills.   Sweats.   Drug cravings.   Confusion.  The severity of the withdrawal is based on the individual and varies from person to person. Many people choose to continue using narcotics to get rid of the discomfort of withdrawal. They also use to try to feel normal.  TREATMENT   Quitting an addiction means stopping use of all chemicals. This is hard but may save your life. With continual drug use, possible outcomes are often loss of self respect and esteem, violence, death, and eventually prison if the use of narcotics has led to the death of another.  Addiction cannot be cured, but it can be stopped. This often requires outside help and the care of professionals. Most hospitals and clinics can refer you to a specialized care center.  It is not necessary for you to go through the uncomfortable symptoms of withdrawal. Your caregiver can provide you with medications that will help you through this difficult  period. Try to avoid situations, friends, or alcohol, which may have made it possible for you to continue using narcotics in the past. Learn how to say no!  HOME CARE INSTRUCTIONS    Drink fluids, get plenty of rest, and take hot baths.   Medicines may be prescribed to help control withdrawal symptoms.   Over-the-counter medicines may be helpful to control diarrhea or an upset stomach.   If your problems resulted from taking prescription pain medicines, make sure you have a follow-up visit with your caregiver within the next few days. Be open about this problem.   If you are dependent or addicted to street drugs, contact a local drug and alcohol treatment center or Narcotics Anonymous.   Have someone with you to monitor your symptoms.   Engage in healthy activities with friends who do not use drugs.   Stay away from the drug scene.  It takes a long period of time to overcome addictions to all drugs. There may be times when you feel as though you want to use. Following loss of a physical addiction and going through withdrawal, you have conquered the most difficult part of getting rid of an addiction. Gradually, you will have a lessening of the craving that is telling you that you need narcotics to feel normal. Call your caregiver or a member of your support group if more support is needed. Learn who to talk to in your family and among your friends so that during these periods you can receive outside help.  SEEK IMMEDIATE   controlled, especially if you cannot keep liquids down.   You are seeing things or hearing voices that are not really there (hallucinating).   You have a seizure.  Document Released: 09/11/2002 Document Revised: 03/03/2011 Document Reviewed: 06/16/2008 [facility] Patient Information 2012 [place], [place].

## 2011-08-28 NOTE — ED Notes (Signed)
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 Denies any SI or HI. Calm and cooperative at triage.

## 2011-08-28 NOTE — ED Provider Notes (Signed)
History     CSN: [id number]  Arrival date & time 08/28/11  1451   First MD Initiated Contact with Patient 08/28/11 1508      Chief Complaint  Patient presents with  . Medical Clearance    (Consider location/radiation/quality/duration/timing/severity/associated sxs/prior treatment) The history is provided by the patient and the spouse.   the patient reports prolonged use of narcotics and abuse.  He's been decreasing his narcotics over the past 4 days and was recently seen an outside facility and was prescribed Nucynta and Buprenorphine and attempt to wean the patient off.  He reports severe nausea and vomiting and inability to keep fluids down.  He reports feeling generally weak.  Nothing worsens the symptoms.  Nothing improves his symptoms.  Symptoms are constant.  He denies other drugs of abuse.  He denies alcohol abuse.  He's in the ER with his wife  Past Medical History  Diagnosis Date  . Celiac disease   . Mitral valve prolapse   . Asthma     mild during peak allery season  . GERD (gastroesophageal reflux disease)     Past Surgical History  Procedure Date  . Knee arthroscopy   . Back surgery 04/2007    History reviewed. No pertinent family history.  History  Substance Use Topics  . Smoking status: Former Games developer  . Smokeless tobacco: Never Used  . Alcohol Use: No      Review of Systems  All other systems reviewed and are negative.    Allergies  Penicillins; Sulfa antibiotics; and Singulair  Home Medications   Current Outpatient Rx  Name Route Sig Dispense Refill  . ALBUTEROL SULFATE HFA 108 (90 BASE) MCG/ACT IN AERS Inhalation Inhale 2 puffs into the lungs every 6 (six) hours as needed. For shortness of breath     . BUPRENORPHINE 5 MCG/HR TD PTWK Transdermal Place 1 patch onto the skin every 14 (fourteen) days.    Marland Kitchen CLONIDINE HCL 0.1 MG PO TABS Oral Take 0.1 mg by mouth daily.    Marland Kitchen ESOMEPRAZOLE MAGNESIUM 40 MG PO CPDR Oral Take 40 mg by mouth daily before  breakfast.      . ONDANSETRON HCL 8 MG PO TABS Oral Take 8 mg by mouth every 12 (twelve) hours as needed. For nausea    . OXYCODONE-ACETAMINOPHEN 5-325 MG PO TABS Oral Take 1 tablet by mouth every 4 (four) hours as needed. For pain    . PROMETHAZINE HCL 25 MG PO TABS Oral Take 25 mg by mouth every 6 (six) hours as needed. For nausea    . TAPENTADOL HCL 75 MG PO TABS Oral Take 75 mg by mouth 4 (four) times daily.      BP 143/88  Pulse 82  Temp(Src) 98.4 F (36.9 C) (Oral)  Resp 17  SpO2 98%  Physical Exam  Nursing note and vitals reviewed. Constitutional: He is oriented to person, place, and time. He appears well-developed and well-nourished.  HENT:  Head: Normocephalic and atraumatic.       Mucous membranes dry  Eyes: EOM are normal.  Neck: Normal range of motion.  Cardiovascular: Regular rhythm, normal heart sounds and intact distal pulses.        Tachycardic  Pulmonary/Chest: Effort normal and breath sounds normal. No respiratory distress.  Abdominal: Soft. He exhibits no distension. There is no tenderness.  Musculoskeletal: Normal range of motion.  Neurological: He is alert and oriented to person, place, and time.  Skin: Skin is warm and dry.  Psychiatric:  He has a normal mood and affect. Judgment normal.    ED Course  Procedures (including critical care time)  Labs Reviewed  CBC - Abnormal; Notable for the following:    WBC 11.3 (*)    All other components within normal limits  BASIC METABOLIC PANEL - Abnormal; Notable for the following:    Glucose, Bld 129 (*)    All other components within normal limits   No results found.   1. Narcotic withdrawal   2. Dehydration       MDM  Narcotic withdrawal.  I recommended that the patient stop all of his narcotics.  He was given IV fluids antiemetics the ER.  He feels much better at this time.  Discharge home with Ambien, clonidine, Phenergan.  The patient is sober at home.  He's been encouraged to continue abstinence  of narcotics.        Lyanne Co, MD 08/28/11 [phone]

## 2011-08-28 NOTE — ED Notes (Signed)
Pt up to restroom to void and back to bed without difficulty.

## 2011-11-17 ENCOUNTER — Observation Stay (HOSPITAL_COMMUNITY)
Admission: EM | Admit: 2011-11-17 | Discharge: 2011-11-18 | Disposition: A | Discharge status: Home / Self Care | Payer: BC Managed Care – PPO | Attending: Internal Medicine | Admitting: Internal Medicine

## 2011-11-17 ENCOUNTER — Emergency Department (HOSPITAL_COMMUNITY): Payer: BC Managed Care – PPO

## 2011-11-17 ENCOUNTER — Encounter (HOSPITAL_COMMUNITY): Payer: Self-pay

## 2011-11-17 DIAGNOSIS — T7840XA Allergy, unspecified, initial encounter: Secondary | ICD-10-CM

## 2011-11-17 DIAGNOSIS — R5383 Other fatigue: Secondary | ICD-10-CM | POA: Insufficient documentation

## 2011-11-17 DIAGNOSIS — R5381 Other malaise: Secondary | ICD-10-CM | POA: Insufficient documentation

## 2011-11-17 DIAGNOSIS — Z79899 Other long term (current) drug therapy: Secondary | ICD-10-CM | POA: Insufficient documentation

## 2011-11-17 DIAGNOSIS — I10 Essential (primary) hypertension: Secondary | ICD-10-CM | POA: Insufficient documentation

## 2011-11-17 DIAGNOSIS — J45909 Unspecified asthma, uncomplicated: Secondary | ICD-10-CM | POA: Insufficient documentation

## 2011-11-17 DIAGNOSIS — G8929 Other chronic pain: Secondary | ICD-10-CM

## 2011-11-17 DIAGNOSIS — Y929 Unspecified place or not applicable: Secondary | ICD-10-CM | POA: Insufficient documentation

## 2011-11-17 DIAGNOSIS — R0602 Shortness of breath: Secondary | ICD-10-CM | POA: Insufficient documentation

## 2011-11-17 DIAGNOSIS — R945 Abnormal results of liver function studies: Secondary | ICD-10-CM | POA: Insufficient documentation

## 2011-11-17 DIAGNOSIS — R609 Edema, unspecified: Secondary | ICD-10-CM | POA: Insufficient documentation

## 2011-11-17 DIAGNOSIS — M7989 Other specified soft tissue disorders: Secondary | ICD-10-CM

## 2011-11-17 DIAGNOSIS — R531 Weakness: Secondary | ICD-10-CM

## 2011-11-17 DIAGNOSIS — R4182 Altered mental status, unspecified: Secondary | ICD-10-CM

## 2011-11-17 DIAGNOSIS — K1370 Unspecified lesions of oral mucosa: Secondary | ICD-10-CM

## 2011-11-17 DIAGNOSIS — K219 Gastro-esophageal reflux disease without esophagitis: Secondary | ICD-10-CM | POA: Insufficient documentation

## 2011-11-17 LAB — COMPREHENSIVE METABOLIC PANEL
ALT: 185 U/L — ABNORMAL HIGH (ref 0–53)
AST: 127 U/L — ABNORMAL HIGH (ref 0–37)
Albumin: 3.4 g/dL — ABNORMAL LOW (ref 3.5–5.2)
Alkaline Phosphatase: 138 U/L — ABNORMAL HIGH (ref 39–117)
BUN: 10 mg/dL (ref 6–23)
CO2: 27 mEq/L (ref 19–32)
Calcium: 8.4 mg/dL (ref 8.4–10.5)
Chloride: 102 mEq/L (ref 96–112)
Creatinine, Ser: 0.75 mg/dL (ref 0.50–1.35)
GFR calc Af Amer: >90 mL/min (ref >90)
GFR calc non Af Amer: >90 mL/min (ref >90)
Glucose, Bld: 150 mg/dL — ABNORMAL HIGH (ref 70–99)
Potassium: 4.1 mEq/L (ref 3.5–5.1)
Sodium: 138 mEq/L (ref 135–145)
Total Bilirubin: 0.2 mg/dL — ABNORMAL LOW (ref 0.3–1.2)
Total Protein: 6.7 g/dL (ref 6.0–8.3)

## 2011-11-17 LAB — CBC
HCT: 40.3 % (ref 39.0–52.0)
HCT: 36.4 % — ABNORMAL LOW (ref 39.0–52.0)
Hemoglobin: 12.9 g/dL — ABNORMAL LOW (ref 13.0–17.0)
Hemoglobin: 11.8 g/dL — ABNORMAL LOW (ref 13.0–17.0)
MCH: 29.5 pg (ref 26.0–34.0)
MCH: 29.6 pg (ref 26.0–34.0)
MCHC: 32 g/dL (ref 30.0–36.0)
MCHC: 32.4 g/dL (ref 30.0–36.0)
MCV: 92 fL (ref 78.0–100.0)
MCV: 91.5 fL (ref 78.0–100.0)
Platelets: 324 10*3/uL (ref 150–400)
Platelets: 308 10*3/uL (ref 150–400)
RBC: 4.38 MIL/uL (ref 4.22–5.81)
RBC: 3.98 MIL/uL — ABNORMAL LOW (ref 4.22–5.81)
RDW: 13 % (ref 11.5–15.5)
RDW: 12.9 % (ref 11.5–15.5)
WBC: 9.9 10*3/uL (ref 4.0–10.5)
WBC: 12.9 10*3/uL — ABNORMAL HIGH (ref 4.0–10.5)

## 2011-11-17 LAB — URINALYSIS, ROUTINE W REFLEX MICROSCOPIC
Bilirubin Urine: NEGATIVE
Glucose, UA: NEGATIVE mg/dL
Hgb urine dipstick: NEGATIVE
Ketones, ur: NEGATIVE mg/dL
Leukocytes, UA: NEGATIVE
Nitrite: NEGATIVE
Protein, ur: NEGATIVE mg/dL
Specific Gravity, Urine: 1.016 (ref 1.005–1.030)
Urobilinogen, UA: 0.2 mg/dL (ref 0.0–1.0)
pH: 7.5 (ref 5.0–8.0)

## 2011-11-17 LAB — CREATININE, SERUM
Creatinine, Ser: 0.71 mg/dL (ref 0.50–1.35)
GFR calc Af Amer: >90 mL/min (ref >90)
GFR calc non Af Amer: >90 mL/min (ref >90)

## 2011-11-17 LAB — DIFFERENTIAL
Basophils Absolute: 0 10*3/uL (ref 0.0–0.1)
Basophils Relative: 0 % (ref 0–1)
Eosinophils Absolute: 0 10*3/uL (ref 0.0–0.7)
Eosinophils Relative: 0 % (ref 0–5)
Lymphocytes Relative: 11 % — ABNORMAL LOW (ref 12–46)
Lymphs Abs: 1.1 10*3/uL (ref 0.7–4.0)
Monocytes Absolute: 0.3 10*3/uL (ref 0.1–1.0)
Monocytes Relative: 3 % (ref 3–12)
Neutro Abs: 8.5 10*3/uL — ABNORMAL HIGH (ref 1.7–7.7)
Neutrophils Relative %: 86 % — ABNORMAL HIGH (ref 43–77)

## 2011-11-17 LAB — SEDIMENTATION RATE: Sed Rate: 17 mm/hr — ABNORMAL HIGH (ref 0–16)

## 2011-11-17 LAB — D-DIMER, QUANTITATIVE: D-Dimer, Quant: 0.59 ug/mL-FEU — ABNORMAL HIGH (ref 0.00–0.48)

## 2011-11-17 LAB — TSH: TSH: 0.372 u[IU]/mL (ref 0.350–4.500)

## 2011-11-17 MED ORDER — SODIUM CHLORIDE 0.9 % IJ SOLN
3.0000 mL | Freq: Two times a day (BID) | INTRAMUSCULAR | Status: DC
  Administered 2011-11-18: 3 mL via INTRAVENOUS

## 2011-11-17 MED ORDER — SODIUM CHLORIDE 0.9 % IJ SOLN: 3.0000 mL | INTRAMUSCULAR | Status: DC | PRN

## 2011-11-17 MED ORDER — ALBUTEROL SULFATE (5 MG/ML) 0.5% IN NEBU: 2.5000 mg | INHALATION_SOLUTION | RESPIRATORY_TRACT | Status: DC | PRN

## 2011-11-17 MED ORDER — MORPHINE SULFATE 4 MG/ML IJ SOLN
4.0000 mg | Freq: Once | INTRAMUSCULAR | Status: AC
  Administered 2011-11-17: 4 mg via INTRAVENOUS
  Filled 2011-11-17: qty 1

## 2011-11-17 MED ORDER — OXYCODONE HCL 5 MG PO TABS
5.0000 mg | ORAL_TABLET | ORAL | Status: DC | PRN
  Administered 2011-11-17: 5 mg via ORAL
  Filled 2011-11-17: qty 1

## 2011-11-17 MED ORDER — ALPRAZOLAM 0.5 MG PO TABS
0.5000 mg | ORAL_TABLET | Freq: Three times a day (TID) | ORAL | Status: DC | PRN
  Administered 2011-11-17 – 2011-11-18 (×2): 0.5 mg via ORAL
  Filled 2011-11-17 (×2): qty 1

## 2011-11-17 MED ORDER — IOHEXOL 300 MG/ML  SOLN
100.0000 mL | Freq: Once | INTRAMUSCULAR | Status: AC | PRN
  Administered 2011-11-17: 100 mL via INTRAVENOUS

## 2011-11-17 MED ORDER — SODIUM CHLORIDE 0.9 % IV SOLN: 250.0000 mL | INTRAVENOUS | Status: DC | PRN

## 2011-11-17 MED ORDER — ONDANSETRON HCL 4 MG/2ML IJ SOLN
4.0000 mg | Freq: Once | INTRAMUSCULAR | Status: AC
  Administered 2011-11-17: 4 mg via INTRAVENOUS
  Filled 2011-11-17: qty 2

## 2011-11-17 MED ORDER — SODIUM CHLORIDE 0.9 % IJ SOLN
3.0000 mL | Freq: Two times a day (BID) | INTRAMUSCULAR | Status: DC
  Administered 2011-11-17 (×2): 3 mL via INTRAVENOUS

## 2011-11-17 MED ORDER — ALBUTEROL (5 MG/ML) CONTINUOUS INHALATION SOLN
10.0000 mg/h | INHALATION_SOLUTION | RESPIRATORY_TRACT | Status: DC
  Administered 2011-11-17: 10 mg/h via RESPIRATORY_TRACT

## 2011-11-17 MED ORDER — ENOXAPARIN SODIUM 40 MG/0.4ML ~~LOC~~ SOLN
40.0000 mg | SUBCUTANEOUS | Status: DC
  Administered 2011-11-17: 40 mg via SUBCUTANEOUS
  Filled 2011-11-17 (×2): qty 0.4

## 2011-11-17 NOTE — ED Notes (Signed)
EKG given to EDP, Opitz,MD. 

## 2011-11-17 NOTE — ED Notes (Signed)
Pt complains of being short of breath since Saturday. He started cipro on Saturday and that night started having trouble breathing, feverish, rash on forehead and arms, bilateral legs and feet swelling. Wife states that hes also disoriented at times

## 2011-11-17 NOTE — H&P (Signed)
Hospital Admission Note Date: 11/17/2011  Patient name: Paul Roman Medical record number: [id number] Date of birth: 16-Feb-1971 Age: 41 y.o. Gender: male PCP: Lenora Boys, MD, MD  Attending physician: Altha Harm, MD  Chief Complaint:SOB and Wheezing and LE swelling  x 4 days.  History of Present Illness:Pt is a 41 y/o whose symptoms began approximately 1 week ago with some hematuria. He was seen in the office by his PMD Dr. Nelda Severe and treated empirically with cipro for a presumed  Prostatitis as the patient refused a prostate examination (per Dr. Tyrell Antonio) and the urine was subsequently found to be negative for infection. The patient had also been taking long acting pain medication and was also changed at that time to methadone. The patient immediately began taking the methadone but did not initiate the cipro until 2 days later.  On the same day that the patient began taking the cipro, he developed symptoms of tactile fever body aches and diarrhea. The next day he noted swelling in BLE's, a non pruritic macular rash and wheezing. He was seen at an urgent care and prescribed benadryl. The patient's wife also notes that he appeared somewhat confused 2 days ago (5 days after onset of symptoms) and was again seen in his PMD's office where the cipro and methadone were discontinued, and  he was given a depot of solumedrol and started on oral prednisone. I spoke with Dr. Tyrell Antonio who saw the patient 2 days ago and he described the presentation as more indicative of an acute episode of reactive airway. He describes the residual rash as macular and non-pruritic and the patient's appearance as non-toxic. He also offered that there has been one or two episodes in the past where the patient has taken his pain medication differently than prescribed which led to symptoms of acute confusion.  Today the patient states that he is concerned about the swelling in his BLE's. HE states that wheezing was an initial  concern but feels that this has improved since receiving a breathing treatment. He denies any orthopnea or PND. There is no pruritis or urticaria. Additionally, there has been no change in the color of his urine.  In review of his labs it appears that the patient has had a threefold increase in his LFT's since last year.  Scheduled Meds:   .  morphine injection  4 mg Intravenous Once  . ondansetron (ZOFRAN) IV  4 mg Intravenous Once   Continuous Infusions:   . albuterol 10 mg/hr (11/17/11 0419)   PRN Meds:.albuterol, iohexol Allergies: Penicillins; Sulfa antibiotics; Glutethimides; and Montelukast sodium Past Medical History  Diagnosis Date  . Celiac disease   . Mitral valve prolapse   . Asthma     mild during peak allery season  . GERD (gastroesophageal reflux disease)    Past Surgical History  Procedure Date  . Knee arthroscopy   . Back surgery 04/2007   History reviewed. No pertinent family history. History   Social History  . Marital Status: Married    Spouse Name: N/A    Number of Children: N/A  . Years of Education: N/A   Occupational History  . Not on file.   Social History Main Topics  . Smoking status: Former Games developer  . Smokeless tobacco: Never Used  . Alcohol Use: No  . Drug Use: No  . Sexually Active:    Other Topics Concern  . Not on file   Social History Narrative  . No narrative on file  Review of Systems: A comprehensive review of systems was negative except as noted in the HPI. Physical Exam: No intake or output data in the 24 hours ending 11/17/11 0930 General: Alert, awake, oriented x3, in no acute distress. Tired but non-toxic appearing. HEENT: [place]/AT PEERL, EOMI Neck: Trachea midline,  no masses, no thyromegal,y no JVD, no carotid bruit OROPHARYNX:  Moist, No exudate/ erythema/lesions. Pt has some ulcerations on the tip of his tongue, but no changes on the buccal mucosa Heart: Regular rate and rhythm, without murmurs, rubs, gallops, PMI  non-displaced, no heaves or thrills on palpation.  Lungs: Clear to auscultation, no wheezing or rhonchi noted. No increased vocal fremitus resonant to percussion  Abdomen: Soft,hepatic tenderness noted, nondistended, positive bowel sounds, no masses. There is hepatomegalynoted..  Neuro: No focal neurological deficits noted cranial nerves II through XII grossly intact. DTRs 2+ bilaterally upper and lower extremities. Strengthfunctional in bilateral upper and lower extremities. Musculoskeletal: No warm swelling or erythema around joints, no spinal tenderness noted. Pt has 2+ edema in BLE's. Psychiatric: Patient alert and oriented x3, good insight and cognition, good recent to remote recall. Lymph node survey: No cervical axillary or inguinal lymphadenopathy noted.  Lab results:  [facility] 11/17/11 0235  NA 138  K 4.1  CL 102  CO2 27  GLUCOSE 150*  BUN 10  CREATININE 0.75  CALCIUM 8.4  MG --  PHOS --    Basename 11/17/11 0235  AST 127*  ALT 185*  ALKPHOS 138*  BILITOT 0.2*  PROT 6.7  ALBUMIN 3.4*   No results found for this basename: LIPASE:2,AMYLASE:2 in the last 72 hours  Basename 11/17/11 0235  WBC 9.9  NEUTROABS 8.5*  HGB 12.9*  HCT 40.3  MCV 92.0  PLT 324   No results found for this basename: CKTOTAL:3,CKMB:3,CKMBINDEX:3,TROPONINI:3 in the last 72 hours No components found with this basename: POCBNP:3  Basename 11/17/11 0235  DDIMER 0.59*   No results found for this basename: HGBA1C:2 in the last 72 hours No results found for this basename: CHOL:2,HDL:2,LDLCALC:2,TRIG:2,CHOLHDL:2,LDLDIRECT:2 in the last 72 hours No results found for this basename: TSH,T4TOTAL,FREET3,T3FREE,THYROIDAB in the last 72 hours No results found for this basename: VITAMINB12:2,FOLATE:2,FERRITIN:2,TIBC:2,IRON:2,RETICCTPCT:2 in the last 72 hours Imaging results:  Dg Chest 2 View  11/17/2011  *RADIOLOGY REPORT*  Clinical Data: Fever, wheezing, shortness of breath, and leg swelling.  CHEST -  2 VIEW  Comparison: 06/22/2011  Findings: The heart size and pulmonary vascularity are normal. The lungs appear clear and expanded without focal air space disease or consolidation. No blunting of the costophrenic angles.  No pneumothorax.  No significant change since previous study.  IMPRESSION: No evidence of active pulmonary disease.  Original Report Authenticated By: Marlon Pel, M.D.   Ct Head Wo Contrast  11/17/2011  *RADIOLOGY REPORT*  Clinical Data: Shortness of breath.  Weakness.  Allergic reaction.  CT HEAD WITHOUT CONTRAST  Technique:  Contiguous axial images were obtained from the base of the skull through the vertex without contrast.  Comparison: None.  Findings: Ventricles and sulci appear symmetrical.  No mass effect or midline shift.  No abnormal extra-axial fluid collection.  No ventricular dilatation.  Gray-white matter junctions are distinct. Basal cisterns are not effaced.  No evidence of acute intracranial hemorrhage.  No depressed skull fractures.  Visualized paranasal sinuses and mastoid air cells are not opacified.  IMPRESSION: No acute intracranial abnormalities.  Original Report Authenticated By: Marlon Pel, M.D.   Ct Angio Chest W/cm &/or Wo Cm  11/17/2011  *  RADIOLOGY REPORT*  Clinical Data: Shortness of breath.  Elevated D-dimer.  Lower extremity swelling.  CT ANGIOGRAPHY CHEST  Technique:  Multidetector CT imaging of the chest using the standard protocol during bolus administration of intravenous contrast. Multiplanar reconstructed images including MIPs were obtained and reviewed to evaluate the vascular anatomy.  Contrast: OMNIPAQUE IOHEXOL 300 MG/ML  SOLN  Comparison: None.  Findings: Technically adequate study with moderately good opacification of the central and proximal segmental pulmonary arteries.  Contrast bolus is somewhat weak and peripheral emboli are not entirely excluded.  No central filling defects demonstrated to suggest significant central  emboli.  Normal heart size. Coronary artery calcification.  Normal caliber thoracic aorta.  No significant lymphadenopathy in the chest.  Esophagus is decompressed.  No pleural effusions.  No focal airspace consolidation or interstitial changes in the lungs.  No pneumothorax.  Airways appear patent.  Images obtained incidentally of the upper abdomen demonstrate diffuse fatty infiltration of the liver.  IMPRESSION: No evidence of significant central pulmonary emboli.  No evidence of active pulmonary disease.  Fatty infiltration of the liver.  Original Report Authenticated By: Marlon Pel, M.D.   Other results: EKG: Normal sinus rhythm   Patient Active Hospital Problem List:  1. Patient presents with what appears to be a partially treated allergic reaction. At this time the patient is clinically stable and shows no sign of serious compromise either airway or hemodynamics system. I will discontinue all medications directed towards the treatment of the allergy and observe the patient for the next 24 hours.  2. Chronic narcotic use: It is quite possible the patient's decreased level of consciousness and confusion reported at home may very well be due to the effects of narcotic medications. At this time I will discontinue all long-acting narcotics and treat only the short acting medications.  3. Bilateral lower extremity edema: I will send the patient over the next 24 hours. We'll also check weights on him and diurese as necessary. The patient does not appear to have a component of respiratory compromise as a result of any swelling and he does not give any signs and symptoms that could be consistent with a cardiorespiratory component contributing to this. The swelling appears to be mostly compromising his aesthetics and if there is no change in the lower extremity swelling by tomorrow I will institute Lasix to help with some diuresis.  4. Abnormal LFTs: I will get acute hepatitis panel on the patient  and friend his liver function tests. It is quite possible from the description the patient may have had a viral illness which has contributed to rise in his liver function tests.  Further decisions about therapy and testing will be made based on the patient's observe clinical course and results of initial testing  Erial Fikes A. 11/17/2011, 9:30 AM  SOB and

## 2011-11-17 NOTE — ED Provider Notes (Signed)
History     CSN: [id number]  Arrival date & time 11/17/11  0136   First MD Initiated Contact with Patient 11/17/11 0206      Chief Complaint  Patient presents with  . Shortness of Breath  . Allergic Reaction    (Consider location/radiation/quality/duration/timing/severity/associated sxs/prior treatment) HPI Patient presents the emergency department with generalized weakness, shortness of breath, and wheezing since Saturday.  Patient, states she has felt feverish.  Patient denies chest pain, nausea, vomiting, diarrhea, dizziness, neck pain, back pain, headache or blurred vision.  Patient, states that he also had some rash that he noticed on Saturday.  Patient, states he has had swelling in both lower extremities.  Patient, states it has not tried anything to relieve his symptoms.  He, states he was seen in the emergency department in [place].  Patient, states, all they did was check a chest x-ray and no other testing. Patient states that he was concerned that this was a reaction to the Cipro that he started Saturday.  Past Medical History  Diagnosis Date  . Celiac disease   . Mitral valve prolapse   . Asthma     mild during peak allery season  . GERD (gastroesophageal reflux disease)     Past Surgical History  Procedure Date  . Knee arthroscopy   . Back surgery 04/2007    History reviewed. No pertinent family history.  History  Substance Use Topics  . Smoking status: Former Games developer  . Smokeless tobacco: Never Used  . Alcohol Use: No      Review of Systems All other systems negative except as documented in the HPI. All pertinent positives and negatives as reviewed in the HPI.  Allergies  Penicillins; Sulfa antibiotics; Glutethimides; and Montelukast sodium  Home Medications   Current Outpatient Rx  Name Route Sig Dispense Refill  . ALBUTEROL SULFATE HFA 108 (90 BASE) MCG/ACT IN AERS Inhalation Inhale 2 puffs into the lungs every 6 (six) hours as needed. For  shortness of breath     . ALPRAZOLAM 0.5 MG PO TABS Oral Take 0.5 mg by mouth 3 (three) times daily as needed. For anxiety    . CIPROFLOXACIN HCL 500 MG PO TABS Oral Take 500 mg by mouth 2 (two) times daily.    Marland Kitchen METHADONE HCL 10 MG PO TABS Oral Take 10 mg by mouth every 8 (eight) hours.    . METHYLPREDNISOLONE ACETATE 80 MG/ML IJ SUSP Intramuscular Inject 80 mg into the muscle once.    . OXYCODONE HCL ER 30 MG PO TB12 Oral Take 30 mg by mouth every 12 (twelve) hours.    Marland Kitchen PREDNISONE 20 MG PO TABS Oral Take 20 mg by mouth daily. 3 tabs for 2 days then 2 tabs for 2 days then 1 tab for 2 days then stop. Start date was 11-15-11    . PROMETHAZINE HCL 25 MG PO TABS Oral Take 25 mg by mouth every 6 (six) hours as needed. For nausea      BP 128/92  Pulse 96  Temp(Src) 98.7 F (37.1 C) (Oral)  Resp 16  SpO2 97%  Physical Exam Physical Examination: General appearance - alert, well appearing, and in no distress, oriented to person, place, and time and normal appearing weight Mental status - alert, oriented to person, place, and time Eyes - pupils equal and reactive, extraocular eye movements intact Ears - bilateral TM's and external ear canals normal Nose - normal and patent, no erythema, discharge or polyps Mouth - mucous membranes  moist, pharynx normal without lesions patient has these lesions on his tongue Neck - supple, no significant adenopathy Chest - clear to auscultation, no wheezes, rales or rhonchi, symmetric air entry Heart - normal rate, regular rhythm, normal S1, S2, no murmurs, rubs, clicks or gallops Abdomen - soft, nontender, nondistended, no masses or organomegaly Extremities - peripheral pulses normal,no clubbing or cyanosis, the patient has 2+ pedal and lower extremity edema  ED Course  Procedures (including critical care time)  Labs Reviewed  COMPREHENSIVE METABOLIC PANEL - Abnormal; Notable for the following:    Glucose, Bld 150 (*)    Albumin 3.4 (*)    AST 127 (*)     ALT 185 (*)    Alkaline Phosphatase 138 (*)    Total Bilirubin 0.2 (*)    All other components within normal limits  CBC - Abnormal; Notable for the following:    Hemoglobin 12.9 (*)    All other components within normal limits  DIFFERENTIAL - Abnormal; Notable for the following:    Neutrophils Relative 86 (*)    Neutro Abs 8.5 (*)    Lymphocytes Relative 11 (*)    All other components within normal limits  D-DIMER, QUANTITATIVE - Abnormal; Notable for the following:    D-Dimer, Quant 0.59 (*)    All other components within normal limits  URINALYSIS, ROUTINE W REFLEX MICROSCOPIC   Dg Chest 2 View  11/17/2011  *RADIOLOGY REPORT*  Clinical Data: Fever, wheezing, shortness of breath, and leg swelling.  CHEST - 2 VIEW  Comparison: 06/22/2011  Findings: The heart size and pulmonary vascularity are normal. The lungs appear clear and expanded without focal air space disease or consolidation. No blunting of the costophrenic angles.  No pneumothorax.  No significant change since previous study.  IMPRESSION: No evidence of active pulmonary disease.  Original Report Authenticated By: Marlon Pel, M.D.   Patient will be admitted to the hospital for further evaluation.      MDM   MDM Reviewed: nursing note and vitals Interpretation: labs, ECG, x-ray and CT scan Consults: admitting MD    Date: 11/17/2011  Rate: 92  Rhythm: normal sinus rhythm  QRS Axis: normal  Intervals: normal  ST/T Wave abnormalities: normal  Conduction Disutrbances:none  Narrative Interpretation:   Old EKG Reviewed: none available        Carlyle Dolly, PA-C 11/17/11 [phone]

## 2011-11-17 NOTE — ED Notes (Signed)
Pt. On monitor for difficulty breathing.

## 2011-11-17 NOTE — ED Notes (Signed)
Pt also has blisters in his mouth. Pt has been to [facility] Dr and to primary Dr with no releif

## 2011-11-17 NOTE — ED Notes (Signed)
Pt reports shortness of breath and pain to BLE and to his lower back, symptoms started last Sunday when he started his course of Cipro antibiotics for his bladder infection---pt reports that he has had 3 doses of Cipro. Pt reports that he visited [facility] ED, he was given 2 tablets of Benadryl and was sent home.  His symptoms got worse, went to visit urgent care yesterday and was given a "steroid shot" and discharged with tapered oral Prednisone--- his symptoms did not improve:  Continues to have shortness of breath and both legs continue to "swell".

## 2011-11-17 NOTE — ED Notes (Signed)
Hospitalist into see patient.for admit

## 2011-11-18 DIAGNOSIS — M7989 Other specified soft tissue disorders: Secondary | ICD-10-CM

## 2011-11-18 DIAGNOSIS — T7840XA Allergy, unspecified, initial encounter: Secondary | ICD-10-CM

## 2011-11-18 DIAGNOSIS — G8929 Other chronic pain: Secondary | ICD-10-CM

## 2011-11-18 DIAGNOSIS — K1370 Unspecified lesions of oral mucosa: Secondary | ICD-10-CM

## 2011-11-18 LAB — COMPREHENSIVE METABOLIC PANEL
ALT: 121 U/L — ABNORMAL HIGH (ref 0–53)
AST: 61 U/L — ABNORMAL HIGH (ref 0–37)
Albumin: 3 g/dL — ABNORMAL LOW (ref 3.5–5.2)
Alkaline Phosphatase: 96 U/L (ref 39–117)
BUN: 9 mg/dL (ref 6–23)
CO2: 29 mEq/L (ref 19–32)
Calcium: 8.3 mg/dL — ABNORMAL LOW (ref 8.4–10.5)
Chloride: 103 mEq/L (ref 96–112)
Creatinine, Ser: 0.88 mg/dL (ref 0.50–1.35)
GFR calc Af Amer: >90 mL/min (ref >90)
GFR calc non Af Amer: >90 mL/min (ref >90)
Glucose, Bld: 102 mg/dL — ABNORMAL HIGH (ref 70–99)
Potassium: 3.6 mEq/L (ref 3.5–5.1)
Sodium: 139 mEq/L (ref 135–145)
Total Bilirubin: 0.2 mg/dL — ABNORMAL LOW (ref 0.3–1.2)
Total Protein: 5.9 g/dL — ABNORMAL LOW (ref 6.0–8.3)

## 2011-11-18 LAB — CBC
HCT: 37.1 % — ABNORMAL LOW (ref 39.0–52.0)
Hemoglobin: 11.9 g/dL — ABNORMAL LOW (ref 13.0–17.0)
MCH: 29.8 pg (ref 26.0–34.0)
MCHC: 32.1 g/dL (ref 30.0–36.0)
MCV: 92.8 fL (ref 78.0–100.0)
Platelets: 316 10*3/uL (ref 150–400)
RBC: 4 MIL/uL — ABNORMAL LOW (ref 4.22–5.81)
RDW: 13.3 % (ref 11.5–15.5)
WBC: 10.4 10*3/uL (ref 4.0–10.5)

## 2011-11-18 LAB — DIFFERENTIAL
Basophils Absolute: 0 10*3/uL (ref 0.0–0.1)
Basophils Relative: 0 % (ref 0–1)
Eosinophils Absolute: 0.2 10*3/uL (ref 0.0–0.7)
Eosinophils Relative: 2 % (ref 0–5)
Lymphocytes Relative: 33 % (ref 12–46)
Lymphs Abs: 3.4 10*3/uL (ref 0.7–4.0)
Monocytes Absolute: 0.8 10*3/uL (ref 0.1–1.0)
Monocytes Relative: 7 % (ref 3–12)
Neutro Abs: 6 10*3/uL (ref 1.7–7.7)
Neutrophils Relative %: 58 % (ref 43–77)

## 2011-11-18 LAB — HEPATITIS PANEL, ACUTE
HCV Ab: NEGATIVE
Hep A IgM: NEGATIVE
Hep B C IgM: NEGATIVE
Hepatitis B Surface Ag: NEGATIVE

## 2011-11-18 MED ORDER — OXYCODONE HCL 5 MG PO TABS: 5.0000 mg | ORAL_TABLET | Freq: Four times a day (QID) | ORAL | Status: AC | PRN

## 2011-11-18 MED ORDER — MAGIC MOUTHWASH
5.0000 mL | Freq: Four times a day (QID) | ORAL | Status: DC | PRN
  Filled 2011-11-18: qty 5

## 2011-11-18 MED ORDER — MAGIC MOUTHWASH: 5.0000 mL | Freq: Four times a day (QID) | ORAL | Status: DC | PRN

## 2011-11-18 NOTE — Progress Notes (Signed)
Ambulated in hall on RA O2 sat 95% while ambulating on RA

## 2011-11-18 NOTE — Progress Notes (Signed)
D/C instructions reviewed with pt and spouse, pt has no further questions at this time. Prescription given.

## 2011-11-18 NOTE — Care Management Note (Signed)
    Page 1 of 1   11/18/2011     12:19:13 PM   CARE MANAGEMENT NOTE 11/18/2011  Patient:  Paul Roman, Paul Roman   Account Number:  [id number]  Date Initiated:  11/18/2011  Documentation initiated by:  Lanier Clam  Subjective/Objective Assessment:   ADMITTED W/SOB/LWR LEG SWELLING.     Action/Plan:   FROM HOME   Anticipated DC Date:  11/18/2011   Anticipated DC Plan:  HOME/SELF CARE      DC Planning Services  CM consult      Choice offered to / List presented to:             Status of service:  Completed, signed off Medicare Important Message given?   (If response is "NO", the following Medicare IM given date fields will be blank) Date Medicare IM given:   Date Additional Medicare IM given:    Discharge Disposition:  HOME/SELF CARE  Per UR Regulation:  Reviewed for med. necessity/level of care/duration of stay  If discussed at Long Length of Stay Meetings, dates discussed:    Comments:  11/18/11 [facility] RN,BSN NCM [phone]

## 2011-11-18 NOTE — Discharge Summary (Signed)
Paul Roman MRN: [id number] DOB/AGE: 1971/04/01 41 y.o.  Admit date: 11/17/2011 Discharge date: 11/18/2011  Primary Care Physician:  Lenora Boys, MD, MD   Discharge Diagnoses:   There is no problem list on file for this patient.   DISCHARGE MEDICATION: Medication List  As of 11/18/2011 12:44 PM   STOP taking these medications         ciprofloxacin 500 MG tablet      methadone 10 MG tablet      methylPREDNISolone acetate 80 MG/ML injection      predniSONE 20 MG tablet         TAKE these medications         albuterol 108 (90 BASE) MCG/ACT inhaler   Commonly known as: PROVENTIL HFA;VENTOLIN HFA   Inhale 2 puffs into the lungs every 6 (six) hours as needed. For shortness of breath      ALPRAZolam 0.5 MG tablet   Commonly known as: XANAX   Take 0.5 mg by mouth 3 (three) times daily as needed. For anxiety      magic mouthwash Soln   Take 5 mLs by mouth 4 (four) times daily as needed.      oxycodone 30 MG Tb12   Commonly known as: OXYCONTIN   Take 30 mg by mouth every 12 (twelve) hours.      oxyCODONE 5 MG immediate release tablet   Commonly known as: Oxy IR/ROXICODONE   Take 1 tablet (5 mg total) by mouth every 6 (six) hours as needed.      promethazine 25 MG tablet   Commonly known as: PHENERGAN   Take 25 mg by mouth every 6 (six) hours as needed. For nausea              Consults:     SIGNIFICANT DIAGNOSTIC STUDIES:  Dg Chest 2 View  11/17/2011  *RADIOLOGY REPORT*  Clinical Data: Fever, wheezing, shortness of breath, and leg swelling.  CHEST - 2 VIEW  Comparison: 06/22/2011  Findings: The heart size and pulmonary vascularity are normal. The lungs appear clear and expanded without focal air space disease or consolidation. No blunting of the costophrenic angles.  No pneumothorax.  No significant change since previous study.  IMPRESSION: No evidence of active pulmonary disease.  Original Report Authenticated By: Marlon Pel, M.D.   Ct Head Wo  Contrast  11/17/2011  *RADIOLOGY REPORT*  Clinical Data: Shortness of breath.  Weakness.  Allergic reaction.  CT HEAD WITHOUT CONTRAST  Technique:  Contiguous axial images were obtained from the base of the skull through the vertex without contrast.  Comparison: None.  Findings: Ventricles and sulci appear symmetrical.  No mass effect or midline shift.  No abnormal extra-axial fluid collection.  No ventricular dilatation.  Gray-white matter junctions are distinct. Basal cisterns are not effaced.  No evidence of acute intracranial hemorrhage.  No depressed skull fractures.  Visualized paranasal sinuses and mastoid air cells are not opacified.  IMPRESSION: No acute intracranial abnormalities.  Original Report Authenticated By: Marlon Pel, M.D.   Ct Angio Chest W/cm &/or Wo Cm  11/17/2011  *RADIOLOGY REPORT*  Clinical Data: Shortness of breath.  Elevated D-dimer.  Lower extremity swelling.  CT ANGIOGRAPHY CHEST  Technique:  Multidetector CT imaging of the chest using the standard protocol during bolus administration of intravenous contrast. Multiplanar reconstructed images including MIPs were obtained and reviewed to evaluate the vascular anatomy.  Contrast: OMNIPAQUE IOHEXOL 300 MG/ML  SOLN  Comparison: None.  Findings: Technically  adequate study with moderately good opacification of the central and proximal segmental pulmonary arteries.  Contrast bolus is somewhat weak and peripheral emboli are not entirely excluded.  No central filling defects demonstrated to suggest significant central emboli.  Normal heart size. Coronary artery calcification.  Normal caliber thoracic aorta.  No significant lymphadenopathy in the chest.  Esophagus is decompressed.  No pleural effusions.  No focal airspace consolidation or interstitial changes in the lungs.  No pneumothorax.  Airways appear patent.  Images obtained incidentally of the upper abdomen demonstrate diffuse fatty infiltration of the liver.  IMPRESSION:  No evidence of significant central pulmonary emboli.  No evidence of active pulmonary disease.  Fatty infiltration of the liver.  Original Report Authenticated By: Marlon Pel, M.D.           No results found for this or any previous visit (from the past 240 hour(s)).  BRIEF ADMITTING H & P: Pt is a 41 y/o whose symptoms began approximately 1 week ago with some hematuria. He was seen in the office by his PMD Dr. Nelda Severe and treated empirically with cipro for a presumed Prostatitis as the patient refused a prostate examination (per Dr. Tyrell Antonio) and the urine was subsequently found to be negative for infection. The patient had also been taking long acting pain medication and was also changed at that time to methadone. The patient immediately began taking the methadone but did not initiate the cipro until 2 days later. On the same day that the patient began taking the cipro, he developed symptoms of tactile fever body aches and diarrhea. The next day he noted swelling in BLE's, a non pruritic macular rash and wheezing. He was seen at an urgent care and prescribed benadryl. The patient's wife also notes that he appeared somewhat confused 2 days ago (5 days after onset of symptoms) and was again seen in his PMD's office where the cipro and methadone were discontinued, and he was given a depot of solumedrol and started on oral prednisone. I spoke with Dr. Tyrell Antonio who saw the patient 2 days ago and he described the presentation as more indicative of an acute episode of reactive airway. He describes the residual rash as macular and non-pruritic and the patient's appearance as non-toxic. He also offered that there has been one or two episodes in the past where the patient has taken his pain medication differently than prescribed which led to symptoms of acute confusion.   On admission, the patient was concerned about the swelling in his BLE's. HE states that wheezing was an initial concern but felt  that this  has improved since receiving a breathing treatment. He denies any orthopnea or PND. There is no pruritis or urticaria. Additionally, there has been no change in the color of his urine.   In review of his labs it appears that the patient has had a threefold increase in his LFT's since last year.    Hospital Course:  Present on Admission:  1. Patient presented with what appears to be a partially treated allergic reaction. At the time of presentation,  the patient was clinically stable and showed no sign of serious compromise of either airway or hemodynamics system. I  discontinued  all medications directed towards the treatment of the allergy and observed the patient who had no escalation of symptoms. The patient also had an ESR which was 17 which was within normal limits.   2. Chronic narcotic use: It is quite possible the patient's decreased level of  consciousness and confusion reported at home may very well be due to the effects of narcotic medications.   All long-acting narcotics were discontinued and the patient was treated only with short acting narcotics. I spoke with Dr. Foy Guadalajara and he informed that he had started the patient on MS Contin and 30 pills of Oxycodone for breakthrough pain.  3. Bilateral lower extremity edema: I will send the patient over the next 24 hours. We'll also check weights on him and diurese as necessary. The patient did not  have a component of respiratory compromise as a result of any swelling and he did not give any signs and symptoms that could be consistent with a cardiorespiratory component contributing to this. The swelling is felt to be likely secondary to fluid retention associated with depot steroids and prednisone use.  4. Abnormal LFTs: The patient had a mild rise in LFT's without any other clinical signs of hepatic disease. However, the patient's wife reported transient jaundice and thus hepatitis panel was checked. The hep C and Hep B IgG were negative and Hep B  IgM is still pending. I did not pursue any further investigations of other viruses (EBV, CMV) as ESR was normal. However, if her has persistent symptoms.       Disposition and Follow-up:  Discharge Orders    Future Orders Please Complete By Expires   Diet general      Activity as tolerated - No restrictions         DISCHARGE EXAM:  General: Alert, awake, oriented x3, in no acute distress. Tired but non-toxic appearing.  Vital Signs: Blood pressure 120/80, pulse 85, temperature 97.8 F (36.6 C), temperature source Axillary, resp. rate 19, height 5\' 10"  (1.778 m), weight 107.3 kg (236 lb 8.9 oz), SpO2 100.00%. HEENT: [place]/AT PEERL, EOMI  Neck: Trachea midline, no masses, no thyromegal,y no JVD, no carotid bruit  OROPHARYNX: Moist, No exudate/ erythema/lesions. Pt has some ulcerations on the tip of his tongue, but no changes on the buccal mucosa  Heart: Regular rate and rhythm, without murmurs, rubs, gallops, PMI non-displaced, no heaves or thrills on palpation.  Lungs: Clear to auscultation, no wheezing or rhonchi noted. No increased vocal fremitus resonant to percussion  Abdomen: Soft,hepatic tenderness noted, nondistended, positive bowel sounds, no masses. There is hepatomegalynoted..  Neuro: No focal neurological deficits noted cranial nerves II through XII grossly intact. DTRs 2+ bilaterally upper and lower extremities. Strength normal in bilateral upper and lower extremities.  Musculoskeletal: No warm swelling or erythema around joints, no spinal tenderness noted. Pt has 1+ edema in BLE's.     Basename 11/18/11 0530 11/17/11 1059 11/17/11 0235  NA 139 -- 138  K 3.6 -- 4.1  CL 103 -- 102  CO2 29 -- 27  GLUCOSE 102* -- 150*  BUN 9 -- 10  CREATININE 0.88 0.71 --  CALCIUM 8.3* -- 8.4  MG -- -- --  PHOS -- -- --    Basename 11/18/11 0530 11/17/11 0235  AST 61* 127*  ALT 121* 185*  ALKPHOS 96 138*  BILITOT 0.2* 0.2*  PROT 5.9* 6.7  ALBUMIN 3.0* 3.4*   No results found for  this basename: LIPASE:2,AMYLASE:2 in the last 72 hours  Basename 11/18/11 0530 11/17/11 1059 11/17/11 0235  WBC 10.4 12.9* --  NEUTROABS 6.0 -- 8.5*  HGB 11.9* 11.8* --  HCT 37.1* 36.4* --  MCV 92.8 91.5 --  PLT 316 308 --   Total time for discharge process including face to face time  approximately 47 minutes Signed: Henery Betzold A. 11/18/2011, 12:44 PM

## 2011-11-18 NOTE — ED Provider Notes (Signed)
Medical screening examination/treatment/procedure(s) were performed by non-physician practitioner and as supervising physician I was immediately available for consultation/collaboration.  Sunnie Nielsen, MD 11/18/11 0630

## 2011-11-19 NOTE — Progress Notes (Signed)
Discharge summary sent to payer through [facility]  

## 2012-02-24 ENCOUNTER — Other Ambulatory Visit: Payer: Self-pay | Admitting: Orthopedic Surgery

## 2012-02-25 ENCOUNTER — Encounter (HOSPITAL_COMMUNITY): Payer: Self-pay | Admitting: *Deleted

## 2012-02-27 MED ORDER — POVIDONE-IODINE 7.5 % EX SOLN
Freq: Once | CUTANEOUS | Status: DC
  Filled 2012-02-27: qty 118

## 2012-02-27 MED ORDER — CLINDAMYCIN PHOSPHATE 900 MG/50ML IV SOLN
900.0000 mg | INTRAVENOUS | Status: AC
  Administered 2012-02-28: 900 mg via INTRAVENOUS
  Filled 2012-02-27: qty 50

## 2012-02-28 ENCOUNTER — Ambulatory Visit (HOSPITAL_COMMUNITY): Payer: BC Managed Care – PPO | Admitting: Anesthesiology

## 2012-02-28 ENCOUNTER — Inpatient Hospital Stay (HOSPITAL_COMMUNITY)
Admission: RE | Admit: 2012-02-28 | Discharge: 2012-03-01 | DRG: 209 | Disposition: A | Discharge status: Home Health | Payer: BC Managed Care – PPO | Source: Ambulatory Visit | Attending: Orthopedic Surgery | Admitting: Orthopedic Surgery

## 2012-02-28 ENCOUNTER — Encounter (HOSPITAL_COMMUNITY): Payer: Self-pay | Admitting: *Deleted

## 2012-02-28 ENCOUNTER — Encounter (HOSPITAL_COMMUNITY)
Admission: RE | Disposition: A | Discharge status: Home Health | Payer: Self-pay | Source: Ambulatory Visit | Attending: Orthopedic Surgery

## 2012-02-28 ENCOUNTER — Encounter (HOSPITAL_COMMUNITY): Payer: Self-pay | Admitting: Anesthesiology

## 2012-02-28 DIAGNOSIS — J45909 Unspecified asthma, uncomplicated: Secondary | ICD-10-CM | POA: Diagnosis present

## 2012-02-28 DIAGNOSIS — Z888 Allergy status to other drugs, medicaments and biological substances status: Secondary | ICD-10-CM

## 2012-02-28 DIAGNOSIS — M1711 Unilateral primary osteoarthritis, right knee: Secondary | ICD-10-CM

## 2012-02-28 DIAGNOSIS — F411 Generalized anxiety disorder: Secondary | ICD-10-CM | POA: Diagnosis present

## 2012-02-28 DIAGNOSIS — K900 Celiac disease: Secondary | ICD-10-CM | POA: Diagnosis present

## 2012-02-28 DIAGNOSIS — Z882 Allergy status to sulfonamides status: Secondary | ICD-10-CM

## 2012-02-28 DIAGNOSIS — Z881 Allergy status to other antibiotic agents status: Secondary | ICD-10-CM

## 2012-02-28 DIAGNOSIS — Z9089 Acquired absence of other organs: Secondary | ICD-10-CM

## 2012-02-28 DIAGNOSIS — Z87891 Personal history of nicotine dependence: Secondary | ICD-10-CM

## 2012-02-28 DIAGNOSIS — Z880 Allergy status to penicillin: Secondary | ICD-10-CM

## 2012-02-28 DIAGNOSIS — Z23 Encounter for immunization: Secondary | ICD-10-CM

## 2012-02-28 DIAGNOSIS — M1710 Unilateral primary osteoarthritis, unspecified knee: Principal | ICD-10-CM | POA: Diagnosis present

## 2012-02-28 DIAGNOSIS — K219 Gastro-esophageal reflux disease without esophagitis: Secondary | ICD-10-CM | POA: Diagnosis present

## 2012-02-28 DIAGNOSIS — I059 Rheumatic mitral valve disease, unspecified: Secondary | ICD-10-CM | POA: Diagnosis present

## 2012-02-28 HISTORY — PX: KNEE ARTHROPLASTY: SHX992

## 2012-02-28 LAB — SURGICAL PCR SCREEN
MRSA, PCR: NEGATIVE
Staphylococcus aureus: NEGATIVE

## 2012-02-28 LAB — COMPREHENSIVE METABOLIC PANEL
ALT: 159 U/L — ABNORMAL HIGH (ref 0–53)
AST: 107 U/L — ABNORMAL HIGH (ref 0–37)
Albumin: 3.7 g/dL (ref 3.5–5.2)
Alkaline Phosphatase: 132 U/L — ABNORMAL HIGH (ref 39–117)
BUN: 14 mg/dL (ref 6–23)
CO2: 30 mEq/L (ref 19–32)
Calcium: 9.4 mg/dL (ref 8.4–10.5)
Chloride: 104 mEq/L (ref 96–112)
Creatinine, Ser: 0.98 mg/dL (ref 0.50–1.35)
GFR calc Af Amer: >90 mL/min (ref >90)
GFR calc non Af Amer: >90 mL/min (ref >90)
Glucose, Bld: 101 mg/dL — ABNORMAL HIGH (ref 70–99)
Potassium: 4.7 mEq/L (ref 3.5–5.1)
Sodium: 141 mEq/L (ref 135–145)
Total Bilirubin: 0.5 mg/dL (ref 0.3–1.2)
Total Protein: 6.9 g/dL (ref 6.0–8.3)

## 2012-02-28 LAB — PROTIME-INR
INR: 0.97 (ref 0.00–1.49)
Prothrombin Time: 13.1 seconds (ref 11.6–15.2)

## 2012-02-28 LAB — URINALYSIS, ROUTINE W REFLEX MICROSCOPIC
Bilirubin Urine: NEGATIVE
Glucose, UA: NEGATIVE mg/dL
Hgb urine dipstick: NEGATIVE
Ketones, ur: NEGATIVE mg/dL
Leukocytes, UA: NEGATIVE
Nitrite: NEGATIVE
Protein, ur: NEGATIVE mg/dL
Specific Gravity, Urine: 1.022 (ref 1.005–1.030)
Urobilinogen, UA: 0.2 mg/dL (ref 0.0–1.0)
pH: 5.5 (ref 5.0–8.0)

## 2012-02-28 LAB — CBC WITH DIFFERENTIAL/PLATELET
Basophils Absolute: 0 10*3/uL (ref 0.0–0.1)
Basophils Relative: 1 % (ref 0–1)
Eosinophils Absolute: 0.2 10*3/uL (ref 0.0–0.7)
Eosinophils Relative: 3 % (ref 0–5)
HCT: 44.3 % (ref 39.0–52.0)
Hemoglobin: 14.3 g/dL (ref 13.0–17.0)
Lymphocytes Relative: 29 % (ref 12–46)
Lymphs Abs: 2.2 10*3/uL (ref 0.7–4.0)
MCH: 29.4 pg (ref 26.0–34.0)
MCHC: 32.3 g/dL (ref 30.0–36.0)
MCV: 91 fL (ref 78.0–100.0)
Monocytes Absolute: 0.7 10*3/uL (ref 0.1–1.0)
Monocytes Relative: 10 % (ref 3–12)
Neutro Abs: 4.5 10*3/uL (ref 1.7–7.7)
Neutrophils Relative %: 58 % (ref 43–77)
Platelets: 290 10*3/uL (ref 150–400)
RBC: 4.87 MIL/uL (ref 4.22–5.81)
RDW: 13.5 % (ref 11.5–15.5)
WBC: 7.7 10*3/uL (ref 4.0–10.5)

## 2012-02-28 LAB — TYPE AND SCREEN
ABO/RH(D): B POS
Antibody Screen: NEGATIVE

## 2012-02-28 LAB — APTT: aPTT: 25 seconds (ref 24–37)

## 2012-02-28 SURGERY — ARTHROPLASTY, KNEE, TOTAL, USING IMAGELESS COMPUTER-ASSISTED NAVIGATION
Anesthesia: Regional | Site: Knee | Laterality: Right | Wound class: Clean

## 2012-02-28 MED ORDER — TESTOSTERONE 50 MG/5GM (1%) TD GEL
5.0000 g | Freq: Every day | TRANSDERMAL | Status: DC
  Filled 2012-02-28 (×2): qty 5

## 2012-02-28 MED ORDER — SODIUM CHLORIDE 0.9 % IR SOLN
Status: DC | PRN
  Administered 2012-02-28: 1000 mL
  Administered 2012-02-28: 3000 mL

## 2012-02-28 MED ORDER — FENTANYL CITRATE 0.05 MG/ML IJ SOLN
INTRAMUSCULAR | Status: AC
  Filled 2012-02-28: qty 2

## 2012-02-28 MED ORDER — ACETAMINOPHEN 10 MG/ML IV SOLN
1000.0000 mg | Freq: Four times a day (QID) | INTRAVENOUS | Status: AC
  Administered 2012-02-28 – 2012-02-29 (×4): 1000 mg via INTRAVENOUS
  Filled 2012-02-28 (×5): qty 100

## 2012-02-28 MED ORDER — ONDANSETRON HCL 4 MG/2ML IJ SOLN: 4.0000 mg | Freq: Four times a day (QID) | INTRAMUSCULAR | Status: DC | PRN

## 2012-02-28 MED ORDER — POLYETHYLENE GLYCOL 3350 17 G PO PACK: 17.0000 g | PACK | Freq: Every day | ORAL | Status: DC | PRN

## 2012-02-28 MED ORDER — ONDANSETRON HCL 4 MG PO TABS: 4.0000 mg | ORAL_TABLET | Freq: Four times a day (QID) | ORAL | Status: DC | PRN

## 2012-02-28 MED ORDER — HYDROMORPHONE HCL PF 1 MG/ML IJ SOLN
0.2500 mg | INTRAMUSCULAR | Status: DC | PRN
  Administered 2012-02-28 (×2): 0.5 mg via INTRAVENOUS

## 2012-02-28 MED ORDER — METHOCARBAMOL 100 MG/ML IJ SOLN
500.0000 mg | Freq: Four times a day (QID) | INTRAVENOUS | Status: DC | PRN
  Filled 2012-02-28: qty 5

## 2012-02-28 MED ORDER — SODIUM CHLORIDE 0.9 % IJ SOLN: 9.0000 mL | INTRAMUSCULAR | Status: DC | PRN

## 2012-02-28 MED ORDER — KETOROLAC TROMETHAMINE 30 MG/ML IJ SOLN
INTRAMUSCULAR | Status: AC
  Administered 2012-02-28: 15 mg
  Filled 2012-02-28: qty 1

## 2012-02-28 MED ORDER — WARFARIN - PHARMACIST DOSING INPATIENT: Freq: Every day | Status: DC

## 2012-02-28 MED ORDER — NALOXONE HCL 0.4 MG/ML IJ SOLN: 0.4000 mg | INTRAMUSCULAR | Status: DC | PRN

## 2012-02-28 MED ORDER — FERROUS SULFATE 325 (65 FE) MG PO TABS
325.0000 mg | ORAL_TABLET | Freq: Two times a day (BID) | ORAL | Status: DC
  Administered 2012-02-29 – 2012-03-01 (×4): 325 mg via ORAL
  Filled 2012-02-28 (×6): qty 1

## 2012-02-28 MED ORDER — DIPHENHYDRAMINE HCL 25 MG PO TABS
25.0000 mg | ORAL_TABLET | Freq: Four times a day (QID) | ORAL | Status: DC | PRN
  Filled 2012-02-28: qty 1

## 2012-02-28 MED ORDER — MORPHINE SULFATE (PF) 1 MG/ML IV SOLN
INTRAVENOUS | Status: DC
  Administered 2012-02-28: 16.5 mg via INTRAVENOUS
  Administered 2012-02-28 (×2): via INTRAVENOUS
  Administered 2012-02-28 – 2012-02-29 (×2): 28.01 mg via INTRAVENOUS
  Administered 2012-02-29: 1.5 mg via INTRAVENOUS
  Administered 2012-02-29: 14.79 mg via INTRAVENOUS
  Administered 2012-02-29: 27 mg via INTRAVENOUS
  Administered 2012-02-29: 7.5 mg via INTRAVENOUS
  Administered 2012-02-29 (×2): via INTRAVENOUS
  Administered 2012-03-01: 12 mg via INTRAVENOUS
  Administered 2012-03-01: 21 mg via INTRAVENOUS
  Administered 2012-03-01: 01:00:00 via INTRAVENOUS
  Filled 2012-02-28 (×5): qty 25

## 2012-02-28 MED ORDER — OXYCODONE HCL 5 MG PO TABS
30.0000 mg | ORAL_TABLET | Freq: Four times a day (QID) | ORAL | Status: DC | PRN
  Administered 2012-02-29: 5 mg via ORAL
  Administered 2012-03-01 (×4): 15 mg via ORAL
  Administered 2012-03-01: 10 mg via ORAL
  Filled 2012-02-28: qty 3
  Filled 2012-02-28: qty 1
  Filled 2012-02-28: qty 3
  Filled 2012-02-28: qty 2
  Filled 2012-02-28: qty 6

## 2012-02-28 MED ORDER — ZOLPIDEM TARTRATE 5 MG PO TABS: 5.0000 mg | ORAL_TABLET | Freq: Every evening | ORAL | Status: DC | PRN

## 2012-02-28 MED ORDER — WARFARIN SODIUM 10 MG PO TABS
10.0000 mg | ORAL_TABLET | Freq: Once | ORAL | Status: AC
  Administered 2012-02-28: 10 mg via ORAL
  Filled 2012-02-28: qty 1

## 2012-02-28 MED ORDER — ACETAMINOPHEN 10 MG/ML IV SOLN
INTRAVENOUS | Status: DC | PRN
  Administered 2012-02-28: 1000 mg via INTRAVENOUS

## 2012-02-28 MED ORDER — MORPHINE SULFATE (PF) 1 MG/ML IV SOLN
INTRAVENOUS | Status: AC
  Filled 2012-02-28: qty 25

## 2012-02-28 MED ORDER — PROMETHAZINE HCL 25 MG PO TABS: 25.0000 mg | ORAL_TABLET | Freq: Four times a day (QID) | ORAL | Status: DC | PRN

## 2012-02-28 MED ORDER — PATIENT'S GUIDE TO USING COUMADIN BOOK
Freq: Once | Status: DC
  Filled 2012-02-28: qty 1

## 2012-02-28 MED ORDER — PROPOFOL 10 MG/ML IV BOLUS
INTRAVENOUS | Status: DC | PRN
  Administered 2012-02-28: 200 mg via INTRAVENOUS

## 2012-02-28 MED ORDER — DIPHENHYDRAMINE HCL 12.5 MG/5ML PO ELIX: 12.5000 mg | ORAL_SOLUTION | Freq: Four times a day (QID) | ORAL | Status: DC | PRN

## 2012-02-28 MED ORDER — TESTOSTERONE 30 MG/ACT TD SOLN: Freq: Every day | TRANSDERMAL | Status: DC

## 2012-02-28 MED ORDER — ONDANSETRON HCL 4 MG/2ML IJ SOLN: 4.0000 mg | Freq: Once | INTRAMUSCULAR | Status: DC | PRN

## 2012-02-28 MED ORDER — METHOCARBAMOL 100 MG/ML IJ SOLN
500.0000 mg | INTRAVENOUS | Status: AC
  Administered 2012-02-28: 500 mg via INTRAVENOUS
  Filled 2012-02-28: qty 5

## 2012-02-28 MED ORDER — HYDROMORPHONE HCL PF 1 MG/ML IJ SOLN
INTRAMUSCULAR | Status: AC
  Filled 2012-02-28: qty 1

## 2012-02-28 MED ORDER — ALPRAZOLAM 0.5 MG PO TABS: 0.5000 mg | ORAL_TABLET | Freq: Three times a day (TID) | ORAL | Status: DC | PRN

## 2012-02-28 MED ORDER — MUPIROCIN 2 % EX OINT
TOPICAL_OINTMENT | CUTANEOUS | Status: AC
  Filled 2012-02-28: qty 22

## 2012-02-28 MED ORDER — FENTANYL CITRATE 0.05 MG/ML IJ SOLN
INTRAMUSCULAR | Status: DC | PRN
  Administered 2012-02-28: 25 ug via INTRAVENOUS
  Administered 2012-02-28: 100 ug via INTRAVENOUS
  Administered 2012-02-28: 50 ug via INTRAVENOUS
  Administered 2012-02-28: 25 ug via INTRAVENOUS
  Administered 2012-02-28 (×2): 50 ug via INTRAVENOUS
  Administered 2012-02-28: 25 ug via INTRAVENOUS
  Administered 2012-02-28: 100 ug via INTRAVENOUS
  Administered 2012-02-28: 25 ug via INTRAVENOUS
  Administered 2012-02-28: 50 ug via INTRAVENOUS

## 2012-02-28 MED ORDER — DOCUSATE SODIUM 100 MG PO CAPS
100.0000 mg | ORAL_CAPSULE | Freq: Two times a day (BID) | ORAL | Status: DC
  Administered 2012-02-28 – 2012-03-01 (×4): 100 mg via ORAL
  Filled 2012-02-28 (×5): qty 1

## 2012-02-28 MED ORDER — WARFARIN VIDEO: Freq: Once | Status: DC

## 2012-02-28 MED ORDER — DIPHENHYDRAMINE HCL 50 MG/ML IJ SOLN: 12.5000 mg | Freq: Four times a day (QID) | INTRAMUSCULAR | Status: DC | PRN

## 2012-02-28 MED ORDER — ALBUTEROL SULFATE HFA 108 (90 BASE) MCG/ACT IN AERS: 2.0000 | INHALATION_SPRAY | Freq: Four times a day (QID) | RESPIRATORY_TRACT | Status: DC | PRN

## 2012-02-28 MED ORDER — MUPIROCIN 2 % EX OINT
TOPICAL_OINTMENT | Freq: Two times a day (BID) | CUTANEOUS | Status: DC
  Administered 2012-02-28: 11:00:00 via NASAL
  Filled 2012-02-28: qty 22

## 2012-02-28 MED ORDER — NEOSTIGMINE METHYLSULFATE 1 MG/ML IJ SOLN
INTRAMUSCULAR | Status: DC | PRN
  Administered 2012-02-28: 2.5 mg via INTRAVENOUS

## 2012-02-28 MED ORDER — METHOCARBAMOL 500 MG PO TABS
500.0000 mg | ORAL_TABLET | Freq: Four times a day (QID) | ORAL | Status: DC | PRN
  Administered 2012-02-29 – 2012-03-01 (×4): 500 mg via ORAL
  Filled 2012-02-28 (×4): qty 1

## 2012-02-28 MED ORDER — GLYCOPYRROLATE 0.2 MG/ML IJ SOLN
INTRAMUSCULAR | Status: DC | PRN
  Administered 2012-02-28: 0.4 mg via INTRAVENOUS

## 2012-02-28 MED ORDER — LACTATED RINGERS IV SOLN
INTRAVENOUS | Status: DC
  Administered 2012-02-28: 12:00:00 via INTRAVENOUS

## 2012-02-28 MED ORDER — ONDANSETRON HCL 4 MG/2ML IJ SOLN
INTRAMUSCULAR | Status: DC | PRN
  Administered 2012-02-28: 4 mg via INTRAVENOUS

## 2012-02-28 MED ORDER — KETOROLAC TROMETHAMINE 30 MG/ML IJ SOLN
INTRAMUSCULAR | Status: DC | PRN
  Administered 2012-02-28: 30 mg via INTRAVENOUS

## 2012-02-28 MED ORDER — BUPIVACAINE-EPINEPHRINE PF 0.5-1:200000 % IJ SOLN
INTRAMUSCULAR | Status: DC | PRN
  Administered 2012-02-28: 25 mL

## 2012-02-28 MED ORDER — ROCURONIUM BROMIDE 100 MG/10ML IV SOLN
INTRAVENOUS | Status: DC | PRN
  Administered 2012-02-28: 50 mg via INTRAVENOUS

## 2012-02-28 MED ORDER — ALUM & MAG HYDROXIDE-SIMETH 200-200-20 MG/5ML PO SUSP
30.0000 mL | ORAL | Status: DC | PRN
  Administered 2012-03-01: 30 mL via ORAL
  Filled 2012-02-28: qty 30

## 2012-02-28 MED ORDER — DEXTROSE-NACL 5-0.45 % IV SOLN
INTRAVENOUS | Status: DC
  Administered 2012-02-28 – 2012-02-29 (×2): via INTRAVENOUS

## 2012-02-28 MED ORDER — KETOROLAC TROMETHAMINE 15 MG/ML IJ SOLN
15.0000 mg | Freq: Four times a day (QID) | INTRAMUSCULAR | Status: AC
  Administered 2012-02-28 – 2012-02-29 (×4): 15 mg via INTRAVENOUS
  Filled 2012-02-28 (×5): qty 1

## 2012-02-28 MED ORDER — CLINDAMYCIN PHOSPHATE 600 MG/50ML IV SOLN
600.0000 mg | Freq: Four times a day (QID) | INTRAVENOUS | Status: AC
  Administered 2012-02-28 – 2012-02-29 (×2): 600 mg via INTRAVENOUS
  Filled 2012-02-28 (×2): qty 50

## 2012-02-28 MED ORDER — LIDOCAINE HCL (CARDIAC) 20 MG/ML IV SOLN
INTRAVENOUS | Status: DC | PRN
  Administered 2012-02-28: 100 mg via INTRAVENOUS

## 2012-02-28 MED ORDER — LACTATED RINGERS IV SOLN
INTRAVENOUS | Status: DC | PRN
  Administered 2012-02-28 (×4): via INTRAVENOUS

## 2012-02-28 MED ORDER — PROPOFOL 10 MG/ML IV EMUL
INTRAVENOUS | Status: DC | PRN
  Administered 2012-02-28: 200 mg via INTRAVENOUS

## 2012-02-28 SURGICAL SUPPLY — 74 items
APL SKNCLS STERI-STRIP NONHPOA (GAUZE/BANDAGES/DRESSINGS)
BANDAGE ESMARK 6X9 LF (GAUZE/BANDAGES/DRESSINGS) ×1 IMPLANT
BENZOIN TINCTURE PRP APPL 2/3 (GAUZE/BANDAGES/DRESSINGS) ×1 IMPLANT
BLADE SAGITTAL 25.0X1.19X90 (BLADE) ×2 IMPLANT
BLADE SAW SAG 90X13X1.27 (BLADE) ×2 IMPLANT
BNDG CMPR 9X6 STRL LF SNTH (GAUZE/BANDAGES/DRESSINGS) ×1
BNDG ESMARK 6X9 LF (GAUZE/BANDAGES/DRESSINGS) ×2
BOWL SMART MIX CTS (DISPOSABLE) ×2 IMPLANT
CEMENT HV SMART SET (Cement) ×4 IMPLANT
CLOTH BEACON ORANGE TIMEOUT ST (SAFETY) ×2 IMPLANT
CLSR STERI-STRIP ANTIMIC 1/2X4 (GAUZE/BANDAGES/DRESSINGS) ×1 IMPLANT
COVER BACK TABLE 24X17X13 BIG (DRAPES) IMPLANT
COVER SURGICAL LIGHT HANDLE (MISCELLANEOUS) ×2 IMPLANT
CUFF TOURNIQUET SINGLE 34IN LL (TOURNIQUET CUFF) ×2 IMPLANT
CUFF TOURNIQUET SINGLE 44IN (TOURNIQUET CUFF) IMPLANT
DRAPE EXTREMITY T 121X128X90 (DRAPE) ×2 IMPLANT
DRAPE U-SHAPE 47X51 STRL (DRAPES) ×2 IMPLANT
DRSG ADAPTIC 3X8 NADH LF (GAUZE/BANDAGES/DRESSINGS) ×1 IMPLANT
DRSG PAD ABDOMINAL 8X10 ST (GAUZE/BANDAGES/DRESSINGS) ×1 IMPLANT
DURAPREP 26ML APPLICATOR (WOUND CARE) ×2 IMPLANT
ELECT REM PT RETURN 9FT ADLT (ELECTROSURGICAL) ×2
ELECTRODE REM PT RTRN 9FT ADLT (ELECTROSURGICAL) ×1 IMPLANT
EVACUATOR 1/8 PVC DRAIN (DRAIN) ×2 IMPLANT
FACESHIELD LNG OPTICON STERILE (SAFETY) ×2 IMPLANT
GAUZE XEROFORM 5X9 LF (GAUZE/BANDAGES/DRESSINGS) ×1 IMPLANT
GLOVE BIOGEL PI IND STRL 7.0 (GLOVE) IMPLANT
GLOVE BIOGEL PI IND STRL 8 (GLOVE) ×2 IMPLANT
GLOVE BIOGEL PI INDICATOR 7.0 (GLOVE) ×1
GLOVE BIOGEL PI INDICATOR 8 (GLOVE) ×2
GLOVE BIOGEL PI ORTHO PRO SZ7 (GLOVE) ×1
GLOVE ECLIPSE 7.5 STRL STRAW (GLOVE) ×4 IMPLANT
GLOVE PI ORTHO PRO STRL SZ7 (GLOVE) IMPLANT
GLOVE SURG SS PI 6.5 STRL IVOR (GLOVE) ×1 IMPLANT
GLOVE SURG SS PI 7.0 STRL IVOR (GLOVE) ×1 IMPLANT
GOWN PREVENTION PLUS XLARGE (GOWN DISPOSABLE) ×2 IMPLANT
GOWN SRG XL XLNG 56XLVL 4 (GOWN DISPOSABLE) ×1 IMPLANT
GOWN STRL NON-REIN LRG LVL3 (GOWN DISPOSABLE) ×1 IMPLANT
GOWN STRL NON-REIN XL XLG LVL4 (GOWN DISPOSABLE) ×6
HANDPIECE INTERPULSE COAX TIP (DISPOSABLE) ×2
HOOD PEEL AWAY FACE SHEILD DIS (HOOD) ×5 IMPLANT
IMMOBILIZER KNEE 20 (SOFTGOODS)
IMMOBILIZER KNEE 20 THIGH 36 (SOFTGOODS) IMPLANT
IMMOBILIZER KNEE 22 UNIV (SOFTGOODS) ×1 IMPLANT
IMMOBILIZER KNEE 24 THIGH 36 (MISCELLANEOUS) IMPLANT
IMMOBILIZER KNEE 24 UNIV (MISCELLANEOUS)
KIT BASIN OR (CUSTOM PROCEDURE TRAY) ×2 IMPLANT
KIT ROOM TURNOVER OR (KITS) ×2 IMPLANT
MANIFOLD NEPTUNE II (INSTRUMENTS) ×2 IMPLANT
MARKER SPHERE PSV REFLC THRD 5 (MARKER) ×6 IMPLANT
NDL HYPO 25GX1X1/2 BEV (NEEDLE) IMPLANT
NEEDLE HYPO 25GX1X1/2 BEV (NEEDLE) ×2 IMPLANT
NS IRRIG 1000ML POUR BTL (IV SOLUTION) ×2 IMPLANT
PACK TOTAL JOINT (CUSTOM PROCEDURE TRAY) ×2 IMPLANT
PAD ARMBOARD 7.5X6 YLW CONV (MISCELLANEOUS) ×4 IMPLANT
PAD CAST 4YDX4 CTTN HI CHSV (CAST SUPPLIES) ×1 IMPLANT
PADDING CAST COTTON 4X4 STRL (CAST SUPPLIES)
PADDING CAST COTTON 6X4 STRL (CAST SUPPLIES) ×1 IMPLANT
PIN SCHANZ 4MM 130MM (PIN) ×4 IMPLANT
SET HNDPC FAN SPRY TIP SCT (DISPOSABLE) ×1 IMPLANT
SPONGE GAUZE 4X4 12PLY (GAUZE/BANDAGES/DRESSINGS) ×1 IMPLANT
STAPLER VISISTAT 35W (STAPLE) ×1 IMPLANT
STRIP CLOSURE SKIN 1/2X4 (GAUZE/BANDAGES/DRESSINGS) ×1 IMPLANT
SUCTION FRAZIER TIP 10 FR DISP (SUCTIONS) ×2 IMPLANT
SUT MON AB 3-0 SH 27 (SUTURE) ×2
SUT MON AB 3-0 SH27 (SUTURE) IMPLANT
SUT VIC AB 0 CTB1 27 (SUTURE) ×4 IMPLANT
SUT VIC AB 1 CT1 27 (SUTURE) ×4
SUT VIC AB 1 CT1 27XBRD ANBCTR (SUTURE) ×2 IMPLANT
SUT VIC AB 2-0 CTB1 (SUTURE) ×4 IMPLANT
SYR CONTROL 10ML LL (SYRINGE) ×1 IMPLANT
TOWEL OR 17X24 6PK STRL BLUE (TOWEL DISPOSABLE) ×2 IMPLANT
TOWEL OR 17X26 10 PK STRL BLUE (TOWEL DISPOSABLE) ×2 IMPLANT
TRAY FOLEY CATH 14FR (SET/KITS/TRAYS/PACK) ×2 IMPLANT
WATER STERILE IRR 1000ML POUR (IV SOLUTION) ×6 IMPLANT

## 2012-02-28 NOTE — Anesthesia Postprocedure Evaluation (Signed)
  Anesthesia Post-op Note  Patient: Paul Roman  Procedure(s) Performed: Procedure(s) (LRB): COMPUTER ASSISTED TOTAL KNEE ARTHROPLASTY (Right)  Patient Location: PACU  Anesthesia Type: General  Level of Consciousness: awake  Airway and Oxygen Therapy: Patient Spontanous Breathing  Post-op Pain: mild  Post-op Assessment: Post-op Vital signs reviewed  Post-op Vital Signs: Reviewed  Complications: No apparent anesthesia complications

## 2012-02-28 NOTE — Progress Notes (Signed)
Orthopedic Tech Progress Note Patient Details:  Paul Roman [birth year] [id number]  CPM Right Knee CPM Right Knee: On Right Knee Flexion (Degrees): 60  Right Knee Extension (Degrees): 0  Additional Comments: trapeze bar patient helper   Nikki Dom 02/28/2012, 6:45 PM

## 2012-02-28 NOTE — Anesthesia Procedure Notes (Addendum)
Anesthesia Regional Block:  Femoral nerve block  Pre-Anesthetic Checklist: ,, timeout performed, Correct Patient, Correct Site, Correct Laterality, Correct Procedure, Correct Position, site marked, Risks and benefits discussed,  Surgical consent,  Pre-op evaluation,  At surgeon's request and post-op pain management  Laterality: Right  Prep: Maximum Sterile Barrier Precautions used, chloraprep and alcohol swabs       Needles:  Injection technique: Single-shot  Needle Type: Stimulator Needle - 80        Needle insertion depth: 4 cm   Additional Needles:  Procedures: nerve stimulator Femoral nerve block  Nerve Stimulator or Paresthesia:  Response: 0.5 mA, 0.1 ms, 5 cm  Additional Responses:   Narrative:  Start time: 02/28/2012 12:20 PM End time: 02/28/2012 12:25 PM Injection made incrementally with aspirations every 5 mL.  Performed by: Personally  Anesthesiologist: Maren Beach MD  Additional Notes: Pt accepts procedure and risks. 25cc 0.5% Marcaine w/ epi w/o difficulty or discomfort.  GES   Procedure Name: Intubation Date/Time: 02/28/2012 1:33 PM Performed by: Garen Lah Pre-anesthesia Checklist: Patient identified, Timeout performed, Emergency Drugs available, Suction available and Patient being monitored Patient Re-evaluated:Patient Re-evaluated prior to inductionOxygen Delivery Method: Circle system utilized Preoxygenation: Pre-oxygenation with 100% oxygen Intubation Type: IV induction Ventilation: Mask ventilation without difficulty Laryngoscope Size: Mac and 3 Grade View: Grade II Tube type: Oral Airway Equipment and Method: Stylet Placement Confirmation: ETT inserted through vocal cords under direct vision,  positive ETCO2 and breath sounds checked- equal and bilateral Secured at: 21 cm Tube secured with: Tape Dental Injury: Teeth and Oropharynx as per pre-operative assessment

## 2012-02-28 NOTE — Transfer of Care (Signed)
Immediate Anesthesia Transfer of Care Note  Patient: Paul Roman  Procedure(s) Performed: Procedure(s) (LRB): COMPUTER ASSISTED TOTAL KNEE ARTHROPLASTY (Right)  Patient Location: PACU  Anesthesia Type: General and GA combined with regional for post-op pain  Level of Consciousness: awake, alert  and oriented  Airway & Oxygen Therapy: Patient Spontanous Breathing and Patient connected to nasal cannula oxygen  Post-op Assessment: Report given to PACU RN and Post -op Vital signs reviewed and stable  Post vital signs: Reviewed and stable  Complications: No apparent anesthesia complications

## 2012-02-28 NOTE — Progress Notes (Signed)
ANTICOAGULATION CONSULT NOTE - Initial Consult  Pharmacy Consult for Coumadin Indication: VTE prophylaxis  Allergies  Allergen Reactions  . Ciprofloxacin Swelling  . Penicillins Rash  . Sulfa Antibiotics Rash  . Gluten Meal   . Glutethimides     Severe as stated by wife  . Montelukast Sodium    Labs:  Basename 02/28/12 1102  HGB 14.3  HCT 44.3  PLT 290  APTT 25  LABPROT 13.1  INR 0.97  HEPARINUNFRC --  CREATININE 0.98  CKTOTAL --  CKMB --  TROPONINI --    Estimated Creatinine Clearance: 119.1 ml/min (by C-G formula based on Cr of 0.98).   Medical History: Past Medical History  Diagnosis Date  . Celiac disease   . Mitral valve prolapse   . Asthma     mild during peak allery season  . GERD (gastroesophageal reflux disease)   . Complication of anesthesia     was awake during EGD  . Shortness of breath     Assessment: 41 year old s/p TKA, to begin Coumadin for VTE ppx  Goal of Therapy:  INR = 2 Monitor platelets by anticoagulation protocol: Yes   Plan:  1) Coumadin 10 mg po x 1 dose tonight 2) Daily INR 3) Coumadin education  Thank you. Okey Regal, PharmD [phone]  02/28/2012,5:40 PM

## 2012-02-28 NOTE — Anesthesia Preprocedure Evaluation (Addendum)
Anesthesia Evaluation  Patient identified by MRN, date of birth, ID band Patient awake    Reviewed: Allergy & Precautions, H&P , NPO status , Patient's Chart, lab work & pertinent test results, reviewed documented beta blocker date and time   Airway Mallampati: II TM Distance: >3 FB Neck ROM: full    Dental  (+) Teeth Intact and Dental Advisory Given   Pulmonary shortness of breath, asthma ,          Cardiovascular Rhythm:regular Rate:Normal     Neuro/Psych Anxiety    GI/Hepatic GERD-  Controlled and Medicated,  Endo/Other    Renal/GU      Musculoskeletal   Abdominal   Peds  Hematology   Anesthesia Other Findings   Reproductive/Obstetrics                          Anesthesia Physical Anesthesia Plan  ASA: II  Anesthesia Plan: General   Post-op Pain Management:    Induction: Intravenous  Airway Management Planned: Oral ETT  Additional Equipment:   Intra-op Plan:   Post-operative Plan: Extubation in OR  Informed Consent: I have reviewed the patients History and Physical, chart, labs and discussed the procedure including the risks, benefits and alternatives for the proposed anesthesia with the patient or authorized representative who has indicated his/her understanding and acceptance.   Dental advisory given  Plan Discussed with: CRNA and Anesthesiologist  Anesthesia Plan Comments:         Anesthesia Quick Evaluation

## 2012-02-28 NOTE — Preoperative (Signed)
Beta Blockers   Reason not to administer Beta Blockers:Not Applicable. No home beta blockers 

## 2012-02-28 NOTE — Brief Op Note (Signed)
02/28/2012  3:47 PM  PATIENT:  [name]  41 y.o. male  PRE-OPERATIVE DIAGNOSIS:  endstage degenerative joint disease  POST-OPERATIVE DIAGNOSIS:  endstage degenerative joint disease  PROCEDURE:  Procedure(s) (LRB): COMPUTER ASSISTED TOTAL KNEE ARTHROPLASTY (Right)  SURGEON:  Surgeon(s) and Role:    * Harvie Junior, MD - Primary  PHYSICIAN ASSISTANT:   ASSISTANTS: bethune   ANESTHESIA:   general  EBL:  Total I/O In: 2750 [I.V.:2750] Out: 300 [Urine:300]  BLOOD ADMINISTERED:none  DRAINS: (1) Hemovact drain(s) in the r. knee with  Suction Open   LOCAL MEDICATIONS USED:  MARCAINE     SPECIMEN:  No Specimen  DISPOSITION OF SPECIMEN:  N/A  COUNTS:  YES  TOURNIQUET:  * Missing tourniquet times found for documented tourniquets in log:  [id number] *  DICTATION: .Other Dictation: Dictation Number [id number]  PLAN OF CARE: Admit to inpatient   PATIENT DISPOSITION:  PACU - hemodynamically stable.   Delay start of Pharmacological VTE agent (>24hrs) due to surgical blood loss or risk of bleeding: no

## 2012-02-28 NOTE — H&P (Signed)
PREOPERATIVE H&P  Chief Complaint: r. Knee pain  HPI: Paul Roman is a 41 y.o. male who presents for evaluation of r.knee pain. It has been present for greater than 1 year and has been worsening. He has failed conservative measures.  Pt went to [facility] with knee pain and underwent knee scope showing area of exposed bone on knee cap and on med fem condyle.  Pt has failed 1 year of conservative care including injections,knee scope, and medication and has exposed bone on scope with unrelenting pain.  Pain is rated as severe.  Past Medical History  Diagnosis Date  . Celiac disease   . Mitral valve prolapse   . Asthma     mild during peak allery season  . GERD (gastroesophageal reflux disease)   . Complication of anesthesia     was awake during EGD  . Shortness of breath    Past Surgical History  Procedure Date  . Knee arthroscopy     x2 R  . Back surgery 04/2007  . Tonsillectomy    History   Social History  . Marital Status: Married    Spouse Name: N/A    Number of Children: N/A  . Years of Education: N/A   Social History Main Topics  . Smoking status: Former Games developer  . Smokeless tobacco: Never Used  . Alcohol Use: No  . Drug Use: No  . Sexually Active: No   Other Topics Concern  . None   Social History Narrative  . None   History reviewed. No pertinent family history. Allergies  Allergen Reactions  . Ciprofloxacin Swelling  . Penicillins Rash  . Sulfa Antibiotics Rash  . Gluten Meal   . Glutethimides     Severe as stated by wife  . Montelukast Sodium    Prior to Admission medications   Medication Sig Start Date End Date Taking? Authorizing Provider  ALPRAZolam Prudy Feeler) 0.5 MG tablet Take 0.5 mg by mouth 3 (three) times daily as needed. For anxiety   Yes Historical Provider, MD  aspirin EC 81 MG tablet Take 81 mg by mouth daily.   Yes Historical Provider, MD  oxycodone (ROXICODONE) 30 MG immediate release tablet Take 30 mg by mouth every 6 (six)  hours as needed. For pain   Yes Historical Provider, MD  promethazine (PHENERGAN) 25 MG tablet Take 25 mg by mouth every 6 (six) hours as needed. For nausea   Yes Historical Provider, MD  Testosterone (AXIRON TD) Place 1 application onto the skin daily.   Yes Historical Provider, MD  albuterol (PROVENTIL HFA;VENTOLIN HFA) 108 (90 BASE) MCG/ACT inhaler Inhale 2 puffs into the lungs every 6 (six) hours as needed. For shortness of breath    Historical Provider, MD  diphenhydrAMINE (BENADRYL) 25 MG tablet Take 25 mg by mouth every 6 (six) hours as needed. For allergies    Historical Provider, MD     Positive ROS: none  All other systems have been reviewed and were otherwise negative with the exception of those mentioned in the HPI and as above.  Physical Exam: Filed Vitals:   02/28/12 1200  BP:   Pulse: 79  Temp:   Resp:     General: Alert, no acute distress Cardiovascular: No pedal edema Respiratory: No cyanosis, no use of accessory musculature GI: No organomegaly, abdomen is soft and non-tender Skin: No lesions in the area of chief complaint Neurologic: Sensation intact distally Psychiatric: Patient is competent for consent with normal mood and affect Lymphatic: No  axillary or cervical lymphadenopathy  MUSCULOSKELETAL: r. Knee: +med pain .  Grinding on rom  No instability  Assessment/Plan: endstage degenerative joint disease R. knee Plan for Procedure(s): COMPUTER ASSISTED TOTAL KNEE ARTHROPLASTY R. knee  The risks benefits and alternatives were discussed with the patient including but not limited to the risks of nonoperative treatment, versus surgical intervention including infection, bleeding, nerve injury, malunion, nonunion, hardware prominence, hardware failure, need for hardware removal, blood clots, cardiopulmonary complications, morbidity, mortality, among others, and they were willing to proceed.  Predicted outcome is good, although there will be at least a six to nine  month expected recovery.  Octavia Mottola L, MD 02/28/2012 1:11 PM

## 2012-02-28 NOTE — Progress Notes (Signed)
Orthopedic Tech Progress Note Patient Details:  Paul Roman January 24, 1971 [id number]  Patient ID: [name], male   DOB: 03/16/1971, 41 y.o.   MRN: [id number] Viewed order from rn order list  Nikki Dom 02/28/2012, 6:46 PM

## 2012-02-29 ENCOUNTER — Encounter (HOSPITAL_COMMUNITY): Payer: Self-pay | Admitting: Orthopedic Surgery

## 2012-02-29 DIAGNOSIS — M1711 Unilateral primary osteoarthritis, right knee: Secondary | ICD-10-CM | POA: Diagnosis present

## 2012-02-29 LAB — PROTIME-INR
INR: 1.09 (ref 0.00–1.49)
Prothrombin Time: 14.3 seconds (ref 11.6–15.2)

## 2012-02-29 LAB — CBC
HCT: 36.8 % — ABNORMAL LOW (ref 39.0–52.0)
Hemoglobin: 11.6 g/dL — ABNORMAL LOW (ref 13.0–17.0)
MCH: 28.9 pg (ref 26.0–34.0)
MCHC: 31.5 g/dL (ref 30.0–36.0)
MCV: 91.8 fL (ref 78.0–100.0)
Platelets: 260 10*3/uL (ref 150–400)
RBC: 4.01 MIL/uL — ABNORMAL LOW (ref 4.22–5.81)
RDW: 13.8 % (ref 11.5–15.5)
WBC: 10.4 10*3/uL (ref 4.0–10.5)

## 2012-02-29 LAB — BASIC METABOLIC PANEL
BUN: 10 mg/dL (ref 6–23)
CO2: 28 mEq/L (ref 19–32)
Calcium: 8.5 mg/dL (ref 8.4–10.5)
Chloride: 102 mEq/L (ref 96–112)
Creatinine, Ser: 0.84 mg/dL (ref 0.50–1.35)
GFR calc Af Amer: >90 mL/min (ref >90)
GFR calc non Af Amer: >90 mL/min (ref >90)
Glucose, Bld: 104 mg/dL — ABNORMAL HIGH (ref 70–99)
Potassium: 4.2 mEq/L (ref 3.5–5.1)
Sodium: 139 mEq/L (ref 135–145)

## 2012-02-29 MED ORDER — OXYCODONE-ACETAMINOPHEN 5-325 MG PO TABS
2.0000 | ORAL_TABLET | Freq: Once | ORAL | Status: AC
  Administered 2012-02-29: 2 via ORAL

## 2012-02-29 MED ORDER — PNEUMOCOCCAL VAC POLYVALENT 25 MCG/0.5ML IJ INJ
0.5000 mL | INJECTION | INTRAMUSCULAR | Status: AC
  Administered 2012-03-01: 0.5 mL via INTRAMUSCULAR
  Filled 2012-02-29: qty 0.5

## 2012-02-29 MED ORDER — WARFARIN SODIUM 10 MG PO TABS
10.0000 mg | ORAL_TABLET | Freq: Once | ORAL | Status: AC
  Administered 2012-02-29: 10 mg via ORAL
  Filled 2012-02-29: qty 1

## 2012-02-29 NOTE — Progress Notes (Signed)
ANTICOAGULATION CONSULT NOTE -follow up Pharmacy Consult for Coumadin Indication: VTE prophylaxis  Allergies  Allergen Reactions  . Ciprofloxacin Swelling  . Penicillins Rash  . Sulfa Antibiotics Rash  . Gluten Meal   . Glutethimides     Severe as stated by wife  . Montelukast Sodium    Labs:  Basename 02/29/12 0514 02/28/12 1102  HGB 11.6* 14.3  HCT 36.8* 44.3  PLT 260 290  APTT -- 25  LABPROT 14.3 13.1  INR 1.09 0.97  HEPARINUNFRC -- --  CREATININE 0.84 0.98  CKTOTAL -- --  CKMB -- --  TROPONINI -- --    Estimated Creatinine Clearance: 138.9 ml/min (by C-G formula based on Cr of 0.84).   Medical History: Past Medical History  Diagnosis Date  . Celiac disease   . Mitral valve prolapse   . Asthma     mild during peak allery season  . GERD (gastroesophageal reflux disease)   . Complication of anesthesia     was awake during EGD  . Shortness of breath     Assessment: 41 year old s/p TKA, on Coumadin for VTE ppx. POD 1. INR still baseline after 1 dose, as expected.  H/H down to 11.6/36.8 from 14.3/44.3 post op as expected.    Goal of Therapy:  INR = 2 per MD request, usually DC's pts with INR goal 1.5-2    Plan:  1) repeat Coumadin 10 mg po x 1 dose tonight 2) Daily INR 3) Coumadin education - book and video ordered 8/26 Herby Abraham, Pharm.D. [phone] 02/29/2012 10:45 AM

## 2012-02-29 NOTE — Progress Notes (Signed)
Subjective: 1 Day Post-Op Procedure(s) (LRB): COMPUTER ASSISTED TOTAL KNEE ARTHROPLASTY (Right) Patient reports pain as moderate. Reasonably controlled with medication   Objective: Vital signs in last 24 hours: Temp:  [97.4 F (36.3 C)-98.9 F (37.2 C)] 98.5 F (36.9 C) (08/27 0627) Pulse Rate:  [78-94] 80  (08/27 0627) Resp:  [8-20] 20  (08/27 0627) BP: (113-140)/(68-92) 119/68 mmHg (08/27 0627) SpO2:  [95 %-100 %] 98 % (08/27 0627) FiO2 (%):  [2 %-97 %] 96 % (08/27 0557)  Intake/Output from previous day: 08/26 0701 - 08/27 0700 In: 3000 [I.V.:3000] Out: 3225 [Urine:2450; Drains:775] Intake/Output this shift:     Basename 02/29/12 0514 02/28/12 1102  HGB 11.6* 14.3    Basename 02/29/12 0514 02/28/12 1102  WBC 10.4 7.7  RBC 4.01* 4.87  HCT 36.8* 44.3  PLT 260 290    Basename 02/29/12 0514 02/28/12 1102  NA 139 141  K 4.2 4.7  CL 102 104  CO2 28 30  BUN 10 14  CREATININE 0.84 0.98  GLUCOSE 104* 101*  CALCIUM 8.5 9.4    Basename 02/29/12 0514 02/28/12 1102  LABPT -- --  INR 1.09 0.97    Neurologically intact ABD soft Neurovascular intact Sensation intact distally Intact pulses distally No cellulitis present Compartment soft  Assessment/Plan: 1 Day Post-Op Procedure(s) (LRB): COMPUTER ASSISTED TOTAL KNEE ARTHROPLASTY (Right) Advance diet Up with therapy  Angelina Venard L 02/29/2012, 7:59 AM

## 2012-02-29 NOTE — Evaluation (Signed)
Physical Therapy Evaluation Patient Details Name: Paul Roman MRN: [id number] DOB: Aug 13, 1970 Today's Date: 02/29/2012 Time: 4098-1191 PT Time Calculation (min): 19 min  PT Assessment / Plan / Recommendation Clinical Impression  Pt is a 41 y.o. male s/p R TKA.  Patient presents with deficits in functional mobility secondary to increased pain, fatigue, decreased ROM and activity tolerance.  Pt will continue to benefit from skilled PT to improve mobility and maximize independence for discharge home.    PT Assessment  Patient needs continued PT services    Follow Up Recommendations  Home health PT    Barriers to Discharge        Equipment Recommendations  3 in 1 bedside comode    Recommendations for Other Services     Frequency 7X/week    Precautions / Restrictions Precautions Precautions: Knee Required Braces or Orthoses: Knee Immobilizer - Right Restrictions Weight Bearing Restrictions: Yes Other Position/Activity Restrictions: WBAT   Pertinent Vitals/Pain 6/10      Mobility  Bed Mobility Bed Mobility: Supine to Sit;Sitting - Scoot to Edge of Bed Supine to Sit: 4: Min assist Sitting - Scoot to [place] of Bed: 4: Min assist Details for Bed Mobility Assistance: RLE Assist Transfers Transfers: Sit to Stand;Stand to Sit Sit to Stand: 4: Min guard;From bed Stand to Sit: 4: Min guard;To chair/3-in-1;With armrests Ambulation/Gait Ambulation/Gait Assistance: 4: Min guard Ambulation Distance (Feet): 6 Feet Assistive device: Rolling walker Gait Pattern: Step-to pattern Gait velocity: decreased    Exercises     PT Diagnosis: Difficulty walking;Acute pain;Generalized weakness;Abnormality of gait  PT Problem List: Decreased strength;Decreased range of motion;Decreased activity tolerance;Decreased mobility;Decreased knowledge of use of DME;Pain PT Treatment Interventions: DME instruction;Gait training;Stair training;Functional mobility training;Therapeutic  activities;Therapeutic exercise;Patient/family education   PT Goals Acute Rehab PT Goals PT Goal Formulation: With patient Time For Goal Achievement: 03/07/12 Potential to Achieve Goals: Good Pt will go Supine/Side to Sit: with modified independence PT Goal: Supine/Side to Sit - Progress: Goal set today Pt will go Sit to Stand: with modified independence PT Goal: Sit to Stand - Progress: Goal set today Pt will Ambulate: >150 feet;with modified independence;with rolling walker PT Goal: Ambulate - Progress: Goal set today Pt will Go Up / Down Stairs: 3-5 stairs;with modified independence;with rolling walker PT Goal: Up/Down Stairs - Progress: Goal set today Pt will Perform Home Exercise Program: Independently PT Goal: Perform Home Exercise Program - Progress: Goal set today  Visit Information  Last PT Received On: 02/29/12 Assistance Needed: +1    Subjective Data  Subjective: I'm very tired Patient Stated Goal: to go home   Prior Functioning  Home Living Lives With: Spouse;Family Available Help at Discharge: Family Type of Home: House Home Access: Stairs to enter Secretary/administrator of Steps: 5 Entrance Stairs-Rails: Right;Left Home Layout: Able to live on main level with bedroom/bathroom Bathroom Shower/Tub: Tub/shower unit;Curtain Home Adaptive Equipment: Environmental consultant - rolling;Straight cane Prior Function Level of Independence: Independent Able to Take Stairs?: Yes Driving: Yes Vocation: Unemployed    Cognition  Overall Cognitive Status: Appears within functional limits for tasks assessed/performed Arousal/Alertness: Lethargic Orientation Level: Appears intact for tasks assessed Behavior During Session: [facility] for tasks performed    Extremity/Trunk Assessment Right Upper Extremity Assessment RUE ROM/Strength/Tone: [facility] for tasks assessed Left Upper Extremity Assessment LUE ROM/Strength/Tone: [facility] for tasks assessed Right Lower Extremity Assessment RLE ROM/Strength/Tone:  Deficits;Unable to fully assess;Due to pain;Due to precautions Left Lower Extremity Assessment LLE ROM/Strength/Tone: [facility] for tasks assessed   Balance    End  of Session PT - End of Session Equipment Utilized During Treatment: Gait belt;Right knee immobilizer Activity Tolerance: Patient tolerated treatment well;Patient limited by fatigue;Patient limited by pain Patient left: in chair;with call bell/phone within reach;with family/visitor present Nurse Communication: Mobility status CPM Right Knee CPM Right Knee: Off  GP     Fabio Asa 02/29/2012, 9:59 AM Charlotte Crumb, PT DPT  [phone]

## 2012-02-29 NOTE — Op Note (Signed)
Paul Roman, Paul Roman               ACCOUNT NO.:  [id number]  MEDICAL RECORD NO.:  [id number]  LOCATION:  5N10C                        FACILITY:  [facility]  PHYSICIAN:  Harvie Junior, M.D.   DATE OF BIRTH:  [birth year]  DATE OF PROCEDURE:  02/28/2012 DATE OF DISCHARGE:                              OPERATIVE REPORT   PREOPERATIVE DIAGNOSIS:  End stage degenerative joint disease, right knee.  POSTOPERATIVE DIAGNOSIS:  End stage degenerative joint disease, right knee.  PRINCIPAL PROCEDURE: 1. Right total knee replacement with a Sigma system, size 4 femur,     size 4 tibia, 12.5-mm bridging bearing and a 38-mm all-polyethylene     patella. 2. Computer-assisted right total knee replacement.  SURGEON:  Harvie Junior, MD  ASSISTANT:  Marshia Ly, PA  ANESTHESIA:  General.  BRIEF HISTORY:  Mr. Kossman is a 41 year old male with long history of having significant complaints of right knee pain.  He had been treated conservatively for prolonged period of time.  He was initially evaluated and felt to have significant patellofemoral symptoms.  We talked about the possibility of patellofemoral replacement versus total joint replacement.  Ultimately prior to surgery, I became concerned about the workup and his overall situation.  At that point, was sent for second opinion up to [facility].  He was going to see Dr. Almira Coaster of there and ultimately was seen by Dr. Daphine Deutscher.  He underwent arthroscopy. Arthroscopy showed that he had a large cavitary defect on the lateral femoral condyle as well as large cavitary defect in the patellofemoral joint.  We had a long talk about treatment options, but I think given the unrelenting pain that the patient was having and breakdown of the articular cartilage laterally and in the patellofemoral compartment.  He was ultimately taken to the operating room for total knee replacement. Because of his young age and need for perfect neutral and long alignment,  computer assistance was chosen to be used on preoperatively and was brought to the operating room for this procedure.  PROCEDURE:  The patient was brought to the operating room and after adequate level of anesthesia was obtained with general anesthetic, the patient was placed supine on the operating table.  The right leg was prepped and draped in the usual sterile fashion.  Following this, the leg was exsanguinated, blood pressure tourniquet was inflated to 300 mmHg.  Following this, a midline incision was made to the subcutaneous tissue down to the level of the extensor mechanism and then, medial parapatellar arthrotomy was undertaken.  Once this was done, attention was turned towards the removal of the medial and lateral meniscus, retropatellar fat pad, synovium in the anterior aspect of the femur and the anterior and posterior cruciates.  Once this was done, the computer modules were placed, 2 pins in the tibia, 2 pins in the femur, and the arrays were placed.  Once this was completed, attention was directed towards cutting the tibia.  The tibia was cut perpendicular to its long axis under computer assistance.  The femur was then cut perpendicular to the anatomic axis under computer assistance.  Once that was done, spacer blocks were put in place.  Attention was then  turned to the femur where the femur was cut with 3 degrees of external rotation and anterior and posterior cuts were made, chamfer cuts and box.  Once this was completed, attention was turned towards the tibia, which was sized to a 4, was drilled and keeled and attention was then turned towards the placement of a patella, which was cut down to the level of 13 mm.  Then, a 38 paddle was chosen and lugs were drilled.  The lugs were drilled for the femur.  A 10 trial was put in place, it was still little bit tightish.  At that point, we returned back to the tibia, got out our computer assistance modules and recut the tibia  down 2.5 mm and this gave [place] additional space.  Certainly once we finally finished with this, we felt that a 12.5 was a better poly choice than the 10, and this gave perfect full extension, easy full flexion.  There was no rotational malalignment.  At this point, the all-trial components were removed. The knee was copiously and thoroughly lavaged and suctioned dry, and the final components were then cemented into place, size 4 femur, size 4 tibia, 12.5-mm bridging bearing trial with a spacer was placed and the cement was allowed to harden.  The patella was then placed and a clamp was placed to hold it in place.  Once this was completed, the knee was put through a range of motion, it was felt to be stable.  We allowed the cement to all harden and all excess bone cement was removed.  Once that was completed, the attention was turned back to the knee where we trialed a 12.5 spacer.  Tourniquet was let down.  All bleeding was controlled with electrocautery.  We tried a 15 spacers, it was too tight, and tried a 10, which showed it was too loose.  At that point, we set a long 12.5 spacer and the wound was irrigated one final time.  All bleeding was controlled with electrocautery and medium Hemovac drain was placed.  The medial parapatellar arthrotomy was closed with 1 Vicryl running, the skin with 0 and 2-0 Vicryl and 3-0 Monocryl subcuticular. Benzoin and Steri-Strips were applied.  Sterile compressive dressing was applied and the patient was taken to the recovery room, he was noted to be in satisfactory condition.  The estimated blood loss for the procedure was less than 50 mL.     Harvie Junior, M.D.     Ranae Plumber  D:  02/28/2012  T:  02/29/2012  Job:  [id number]

## 2012-02-29 NOTE — Progress Notes (Signed)
Physical Therapy Treatment Patient Details Name: Paul Roman MRN: [id number] DOB: [birth year] Today's Date: 02/29/2012 Time: 1425-1450 PT Time Calculation (min): 25 min  PT Assessment / Plan / Recommendation Comments on Treatment Session  Pt demonstrates increased activity tolerance this afternoon. Will continue to benefit from skilled PT to improve strength, ROM and maximize independence with HEP and mobility.    Follow Up Recommendations  Home health PT    Barriers to Discharge        Equipment Recommendations  3 in 1 bedside comode    Recommendations for Other Services    Frequency 7X/week   Plan Discharge plan remains appropriate    Precautions / Restrictions Precautions Precautions: Knee Required Braces or Orthoses: Knee Immobilizer - Right Restrictions Weight Bearing Restrictions: Yes Other Position/Activity Restrictions: WBAT   Pertinent Vitals/Pain 6/10    Mobility  Bed Mobility Bed Mobility: Supine to Sit;Sitting - Scoot to Edge of Bed Supine to Sit: 4: Min assist Sitting - Scoot to [place] of Bed: 4: Min guard Details for Bed Mobility Assistance: RLE Assist Transfers Transfers: Sit to Stand;Stand to Sit Sit to Stand: 4: Min guard;From bed Stand to Sit: 4: Min guard;To chair/3-in-1;With armrests Ambulation/Gait Ambulation/Gait Assistance: 4: Min guard Ambulation Distance (Feet): 80 Feet Assistive device: Rolling walker Gait Pattern: Step-to pattern;Decreased stride length;Antalgic Gait velocity: decreased     PT Goals Acute Rehab PT Goals PT Goal Formulation: With patient PT Goal: Supine/Side to Sit - Progress: Progressing toward goal PT Goal: Sit to Stand - Progress: Progressing toward goal PT Goal: Ambulate - Progress: Progressing toward goal  Visit Information  Last PT Received On: 02/29/12 Assistance Needed: +1    Subjective Data  Subjective: pt agreeable to PT ambulation this afternoon Patient Stated Goal: to go home   Cognition  Overall  Cognitive Status: Appears within functional limits for tasks assessed/performed Arousal/Alertness: Awake/alert Orientation Level: Appears intact for tasks assessed Behavior During Session: [facility] for tasks performed    Balance     End of Session PT - End of Session Equipment Utilized During Treatment: Gait belt;Right knee immobilizer Activity Tolerance: Patient tolerated treatment well;Patient limited by fatigue;Patient limited by pain Patient left: in chair;with call bell/phone within reach;with family/visitor present Nurse Communication: Mobility status CPM Right Knee CPM Right Knee: Off Right Knee Flexion (Degrees): 60  Right Knee Extension (Degrees): 0    GP     Fabio Asa 02/29/2012, 2:56 PM Charlotte Crumb, PT DPT  [phone]

## 2012-02-29 NOTE — Progress Notes (Signed)
UR COMPLETED  

## 2012-03-01 LAB — CBC
HCT: 34.6 % — ABNORMAL LOW (ref 39.0–52.0)
Hemoglobin: 11.2 g/dL — ABNORMAL LOW (ref 13.0–17.0)
MCH: 29.9 pg (ref 26.0–34.0)
MCHC: 32.4 g/dL (ref 30.0–36.0)
MCV: 92.3 fL (ref 78.0–100.0)
Platelets: 262 10*3/uL (ref 150–400)
RBC: 3.75 MIL/uL — ABNORMAL LOW (ref 4.22–5.81)
RDW: 13.9 % (ref 11.5–15.5)
WBC: 12 10*3/uL — ABNORMAL HIGH (ref 4.0–10.5)

## 2012-03-01 LAB — PROTIME-INR
INR: 2.52 — ABNORMAL HIGH (ref 0.00–1.49)
Prothrombin Time: 27.6 seconds — ABNORMAL HIGH (ref 11.6–15.2)

## 2012-03-01 MED ORDER — OXYCODONE HCL 10 MG PO TB12
30.0000 mg | ORAL_TABLET | Freq: Two times a day (BID) | ORAL | Status: DC
  Administered 2012-03-01: 30 mg via ORAL
  Filled 2012-03-01: qty 3

## 2012-03-01 MED ORDER — OXYCODONE HCL 30 MG PO TABS: 30.0000 mg | ORAL_TABLET | Freq: Four times a day (QID) | ORAL | Status: AC | PRN

## 2012-03-01 MED ORDER — WARFARIN SODIUM 5 MG PO TABS: ORAL_TABLET | ORAL | Status: DC

## 2012-03-01 MED ORDER — OXYCODONE HCL 30 MG PO TB12: 30.0000 mg | ORAL_TABLET | Freq: Two times a day (BID) | ORAL | Status: DC

## 2012-03-01 MED ORDER — METHOCARBAMOL 750 MG PO TABS: ORAL_TABLET | ORAL | Status: DC

## 2012-03-01 NOTE — Progress Notes (Signed)
Physical Therapy Treatment Patient Details Name: Paul Roman MRN: [id number] DOB: January 14, 1971 Today's Date: 03/01/2012 Time: 9147-8295 PT Time Calculation (min): 26 min  PT Assessment / Plan / Recommendation Comments on Treatment Session  Pt unable to tolerate full ther ex program this pm; but program was reviewed and all concerns addressed. Pt has completed stair training in previous session without difficulty. Technique was reviewed with spouse. Pt and spouse educated on home management, safe mobility, transfers and continued PT.    Follow Up Recommendations  Home health PT    Barriers to Discharge        Equipment Recommendations  3 in 1 bedside comode    Recommendations for Other Services    Frequency 7X/week   Plan Discharge plan remains appropriate    Precautions / Restrictions Precautions Precautions: Knee Restrictions Weight Bearing Restrictions: Yes RLE Weight Bearing: Weight bearing as tolerated   Pertinent Vitals/Pain 10/10    Mobility  Bed Mobility Bed Mobility: Not assessed Transfers Transfers: Not assessed    Exercises Total Joint Exercises Ankle Circles/Pumps: AROM;Left;20 reps Quad Sets: AROM;Strengthening;Right;Supine Towel Squeeze: AROM;Strengthening;Right;Supine Short Arc Quad: AROM;Strengthening;Right;Supine Heel Slides: AROM;Strengthening;Right;Supine Hip ABduction/ADduction: AROM;Strengthening;Right;Supine Straight Leg Raises: AROM;Strengthening;Right;Supine    PT Goals Acute Rehab PT Goals PT Goal Formulation: With patient PT Goal: Perform Home Exercise Program - Progress: Progressing toward goal  Visit Information  Last PT Received On: 03/01/12 Assistance Needed: +1    Subjective Data  Subjective: I am in a lot of pain  Patient Stated Goal: to go home   Cognition  Overall Cognitive Status: Appears within functional limits for tasks assessed/performed Arousal/Alertness: Lethargic Orientation Level: Appears intact for tasks  assessed Behavior During Session: [facility] for tasks performed    Balance     End of Session PT - End of Session Activity Tolerance: Patient limited by pain Patient left: in bed;with family/visitor present;with call bell/phone within reach Nurse Communication: Mobility status;Patient requests pain meds   GP     Fabio Asa 03/01/2012, 3:16 PM Charlotte Crumb, PT DPT  [phone]

## 2012-03-01 NOTE — Discharge Summary (Signed)
Patient ID: Paul Roman MRN: [id number] DOB/AGE: Jan 05, 1971 41 y.o.  Admit date: 02/28/2012 Discharge date: 03/01/2012  Admission Diagnoses:  Principal Problem:  *Osteoarthritis of right knee   Discharge Diagnoses:  Same  Past Medical History  Diagnosis Date  . Celiac disease   . Mitral valve prolapse   . Asthma     mild during peak allery season  . GERD (gastroesophageal reflux disease)   . Complication of anesthesia     was awake during EGD  . Shortness of breath     Surgeries: Procedure(s):Right COMPUTER ASSISTED TOTAL KNEE ARTHROPLASTY on 02/28/2012   Discharged Condition: Improved  Hospital Course: Paul Roman is an 41 y.o. male who was admitted 02/28/2012 for operative treatment ofOsteoarthritis of right knee. Patient has severe unremitting pain that affects sleep, daily activities, and work/hobbies. After pre-op clearance the patient was taken to the operating room on 02/28/2012 and underwent  Procedure(s):Right COMPUTER ASSISTED TOTAL KNEE ARTHROPLASTY.    Patient was given perioperative antibiotics: Anti-infectives     Start     Dose/Rate Route Frequency Ordered Stop   02/28/12 1745   clindamycin (CLEOCIN) IVPB 600 mg        600 mg 100 mL/hr over 30 Minutes Intravenous Every 6 hours 02/28/12 1738 2012-03-16 0140   02/27/12 1117   clindamycin (CLEOCIN) IVPB 900 mg        900 mg 100 mL/hr over 30 Minutes Intravenous 60 min pre-op 02/27/12 1117 02/28/12 1328           Patient was given sequential compression devices, early ambulation, and chemoprophylaxis to prevent DVT.  Patient benefited maximally from hospital stay and there were no complications.    Recent vital signs: Patient Vitals for the past 24 hrs:  BP Temp Temp src Pulse Resp SpO2  03/01/12 0800 - - - - 19  100 %  03/01/12 0638 129/81 mmHg 99 F (37.2 C) Oral 92  17  98 %  03/01/12 0400 - - - - 15  98 %  03/01/12 0239 125/78 mmHg 99 F (37.2 C) Oral 94  17  98 %  03/01/12 0112 - - - - 18   100 %  03/01/12 0000 - - - - 18  100 %  2012-03-16 2017 - - - - 15  97 %  03/16/12 2015 - - - - 15  97 %  03/16/2012 2002 134/78 mmHg 99.8 F (37.7 C) Oral 96  18  99 %  Mar 16, 2012 1529 - - - - 20  97 %  Mar 16, 2012 1400 126/80 mmHg 98.2 F (36.8 C) - 98  19  99 %     Recent laboratory studies:  Basename 03/01/12 0600 03-16-12 0514 02/28/12 1102  WBC 12.0* 10.4 --  HGB 11.2* 11.6* --  HCT 34.6* 36.8* --  PLT 262 260 --  NA -- 139 141  K -- 4.2 4.7  CL -- 102 104  CO2 -- 28 30  BUN -- 10 14  CREATININE -- 0.84 0.98  GLUCOSE -- 104* 101*  INR 2.52* 1.09 --  CALCIUM -- 8.5 --     Discharge Medications:   Medication List  As of 03/01/2012 12:41 PM   STOP taking these medications         aspirin EC 81 MG tablet         TAKE these medications         albuterol 108 (90 BASE) MCG/ACT inhaler   Commonly known as: PROVENTIL HFA;VENTOLIN HFA  Inhale 2 puffs into the lungs every 6 (six) hours as needed. For shortness of breath      ALPRAZolam 0.5 MG tablet   Commonly known as: XANAX   Take 0.5 mg by mouth 3 (three) times daily as needed. For anxiety      AXIRON TD   Place 1 application onto the skin daily.      diphenhydrAMINE 25 MG tablet   Commonly known as: BENADRYL   Take 25 mg by mouth every 6 (six) hours as needed. For allergies      methocarbamol 750 MG tablet   Commonly known as: ROBAXIN   Take one tablet every 8 hrs as needed for spasm.      oxycodone 30 MG Tb12   Commonly known as: OXYCONTIN   Take 1 tablet (30 mg total) by mouth every 12 (twelve) hours.      oxycodone 30 MG immediate release tablet   Commonly known as: ROXICODONE   Take 1 tablet (30 mg total) by mouth every 6 (six) hours as needed for pain.      oxycodone 30 MG immediate release tablet   Commonly known as: ROXICODONE   Take 30 mg by mouth every 6 (six) hours as needed. For pain      promethazine 25 MG tablet   Commonly known as: PHENERGAN   Take 25 mg by mouth every 6 (six) hours as  needed. For nausea      warfarin 5 MG tablet   Commonly known as: COUMADIN   Take one daily unless otherwise directed starting on 03/02/12. Shoot for INR of 2.0. Treat x 1 month.            Disposition: 01-Home or Self Care  Discharge Orders    Future Orders Please Complete By Expires   Diet general      Call MD / Call 911      Comments:   If you experience chest pain or shortness of breath, CALL 911 and be transported to the hospital emergency room.  If you develope a fever above 101 F, pus (white drainage) or increased drainage or redness at the wound, or calf pain, call your surgeon's office.   Constipation Prevention      Comments:   Drink plenty of fluids.  Prune juice may be helpful.  You may use a stool softener, such as Colace (over the counter) 100 mg twice a day.  Use MiraLax (over the counter) for constipation as needed.   Increase activity slowly as tolerated      CPM      Comments:   Continuous passive motion machine (CPM):      Use the CPM from0 to 60 degrees for 8hours per day.      You may increase by 10 degrees per day.  You may break it up into 2 or 3 sessions per day.      Use CPM for 1-2 weeks or until you are told to stop.   TED hose      Comments:   Use stockings (TED hose) for 2 weeks on both leg(s).  You may remove them at night for sleeping.   Do not put a pillow under the knee. Place it under the heel.         Follow-up Information    Follow up with GRAVES,JOHN L, MD. Schedule an appointment as soon as possible for a visit in 2 weeks.   Contact information:   [address] [place]  [place] [address] [phone]           Signed: Matthew Folks 03/01/2012, 12:41 PM

## 2012-03-01 NOTE — Progress Notes (Signed)
ANTICOAGULATION CONSULT NOTE -follow up Pharmacy Consult for Coumadin Indication: VTE prophylaxis  Allergies  Allergen Reactions  . Ciprofloxacin Swelling  . Penicillins Rash  . Sulfa Antibiotics Rash  . Gluten Meal   . Glutethimides     Severe as stated by wife  . Montelukast Sodium    Labs:  Basename 03/01/12 0600 02/29/12 0514 02/28/12 1102  HGB 11.2* 11.6* --  HCT 34.6* 36.8* 44.3  PLT 262 260 290  APTT -- -- 25  LABPROT 27.6* 14.3 13.1  INR 2.52* 1.09 0.97  HEPARINUNFRC -- -- --  CREATININE -- 0.84 0.98  CKTOTAL -- -- --  CKMB -- -- --  TROPONINI -- -- --    Estimated Creatinine Clearance: 138.9 ml/min (by C-G formula based on Cr of 0.84).   Assessment: 41 year old s/p TKA, on Coumadin for VTE ppx. POD 2. Huge jump in INR form 1.09 to 2.52 after 2 doses of 10 mg coumadin.  Protime jumped 13.3 seconds.  H/H about the same as yesterday.  No bleeding reported.  Goal of Therapy:  INR = 2 per MD request, usually DC's pts with INR goal 1.5-2    Plan:  1) no coumadin today 2nd high jump and low INR goal per MD 2) Daily INR Herby Abraham, Pharm.D. [phone] 03/01/2012 10:48 AM

## 2012-03-01 NOTE — Progress Notes (Signed)
Physical Therapy Treatment Patient Details Name: Paul Roman MRN: [id number] DOB: 1971/01/27 Today's Date: 03/01/2012 Time: 4098-1191 PT Time Calculation (min): 23 min  PT Assessment / Plan / Recommendation Comments on Treatment Session  Pt able to safely negotiate steps with RW and rail. Pt will benefit from continued PT to review HEP and maximize independence for discharge.    Follow Up Recommendations       Barriers to Discharge        Equipment Recommendations       Recommendations for Other Services    Frequency 7X/week   Plan Discharge plan remains appropriate    Precautions / Restrictions Precautions Precautions: Knee Required Braces or Orthoses: Knee Immobilizer - Right Restrictions Weight Bearing Restrictions: Yes RLE Weight Bearing: Weight bearing as tolerated   Pertinent Vitals/Pain 7/10    Mobility  Bed Mobility Bed Mobility: Supine to Sit;Sitting - Scoot to Edge of Bed Supine to Sit: 4: Min assist Sitting - Scoot to [place] of Bed: 4: Min guard Details for Bed Mobility Assistance: RLE Assist Transfers Transfers: Sit to Stand;Stand to Sit Sit to Stand: From bed;5: Supervision Stand to Sit: To chair/3-in-1;With armrests;5: Supervision Ambulation/Gait Ambulation/Gait Assistance: 5: Supervision Ambulation Distance (Feet): 20 Feet Assistive device: Rolling walker Gait Pattern: Step-to pattern;Decreased stride length;Antalgic Gait velocity: decreased Stairs: Yes Stairs Assistance: 4: Min assist Stairs Assistance Details (indicate cue type and reason): Assist for stabilization of RW Stair Management Technique: Backwards;With walker;One rail Left Number of Stairs: 4       PT Goals Acute Rehab PT Goals PT Goal: Supine/Side to Sit - Progress: Progressing toward goal PT Goal: Sit to Stand - Progress: Progressing toward goal PT Goal: Ambulate - Progress: Progressing toward goal Pt will Go Up / Down Stairs: with min assist;with rolling walker;3-5  stairs PT Goal: Up/Down Stairs - Progress: Met  Visit Information  Last PT Received On: 03/01/12 Assistance Needed: +1    Subjective Data  Subjective: pt in a lot of pain but agreeable for PT Patient Stated Goal: to go home   Cognition  Overall Cognitive Status: Appears within functional limits for tasks assessed/performed Arousal/Alertness: Lethargic Orientation Level: Appears intact for tasks assessed Behavior During Session: [name] for tasks performed    Balance     End of Session PT - End of Session Equipment Utilized During Treatment: Gait belt;Right knee immobilizer Activity Tolerance: Patient tolerated treatment well;Patient limited by fatigue;Patient limited by pain Patient left: in chair;with call bell/phone within reach;with family/visitor present Nurse Communication: Mobility status CPM Right Knee CPM Right Knee: Off   GP     Fabio Asa 03/01/2012, 9:46 AM Charlotte Crumb, PT DPT  [phone]

## 2012-03-01 NOTE — Progress Notes (Signed)
CARE MANAGEMENT NOTE 03/01/2012  Patient:  Paul Roman, Paul Roman   Account Number:  [id number]  Date Initiated:  03/01/2012  Documentation initiated by:  Vance Peper  Subjective/Objective Assessment:   41 yr old male s/p right total knee arthroplasty     Action/Plan:   CM spoke with patient regarding HH needs and DME. Patient was preoperatively setup with [facility], no changes. rolling walker, 3in1 and CPM have been delivered to the home.   Anticipated DC Date:  03/02/2012   Anticipated DC Plan:  HOME W HOME HEALTH SERVICES      DC Planning Services  CM consult      [facility] Choice  HOME HEALTH   Choice offered to / List presented to:  C-1 Patient        HH arranged  HH-2 PT  HH-1 RN      [facility] agency  [facility]   Status of service:  Completed, signed off Medicare Important Message given?   (If response is "NO", the following Medicare IM given date fields will be blank) Date Medicare IM given:   Date Additional Medicare IM given:    Discharge Disposition:  HOME W HOME HEALTH SERVICES  Per UR Regulation:    If discussed at Long Length of Stay Meetings, dates discussed:    Comments:

## 2012-03-01 NOTE — Progress Notes (Signed)
Subjective: 2 Days Post-Op Procedure(s) (LRB): COMPUTER ASSISTED TOTAL KNEE ARTHROPLASTY (Right) Patient reports pain as 6 on 0-10 scale.  Taking po/voiding ok.   Objective: Vital signs in last 24 hours: Temp:  [98.2 F (36.8 C)-99.8 F (37.7 C)] 99 F (37.2 C) (08/28 1610) Pulse Rate:  [92-98] 92  (08/28 0638) Resp:  [15-20] 19  (08/28 0800) BP: (125-134)/(78-81) 129/81 mmHg (08/28 0638) SpO2:  [97 %-100 %] 100 % (08/28 0800) FiO2 (%):  [100 %] 100 % (08/28 0800)  Intake/Output from previous day: 08/27 0701 - 08/28 0700 In: 1115 [I.V.:1115] Out: 1700 [Urine:1400; Drains:300] Intake/Output this shift:     Basename 03/01/12 0600 02/29/12 0514 02/28/12 1102  HGB 11.2* 11.6* 14.3    Basename 03/01/12 0600 02/29/12 0514  WBC 12.0* 10.4  RBC 3.75* 4.01*  HCT 34.6* 36.8*  PLT 262 260    Basename 02/29/12 0514 02/28/12 1102  NA 139 141  K 4.2 4.7  CL 102 104  CO2 28 30  BUN 10 14  CREATININE 0.84 0.98  GLUCOSE 104* 101*  CALCIUM 8.5 9.4    Basename 03/01/12 0600 02/29/12 0514  LABPT -- --  INR 2.52* 1.09  Right knee exam:  Neurovascular intact Sensation intact distally Intact pulses distally Dorsiflexion/Plantar flexion intact Incision: no drainage Compartment soft  Assessment/Plan: 2 Days Post-Op Procedure(s) (LRB): COMPUTER ASSISTED TOTAL KNEE ARTHROPLASTY (Right) Plan: Up with therapy D/C IV fluids Discharge home with home health D/C PCA Morphine Start oxycontin 30mg  BID. Drain pulled  Dressing changed. Follow up Dr Luiz Blare in 2 weeks.  Payam Gribble G 03/01/2012, 12:31 PM

## 2012-06-06 ENCOUNTER — Encounter (HOSPITAL_COMMUNITY): Payer: Self-pay | Admitting: *Deleted

## 2012-06-06 ENCOUNTER — Emergency Department (HOSPITAL_COMMUNITY)
Admission: EM | Admit: 2012-06-06 | Discharge: 2012-06-07 | Disposition: A | Discharge status: Other Acute Inpatient Hospital | Payer: BC Managed Care – PPO | Attending: Emergency Medicine | Admitting: Emergency Medicine

## 2012-06-06 DIAGNOSIS — I059 Rheumatic mitral valve disease, unspecified: Secondary | ICD-10-CM | POA: Insufficient documentation

## 2012-06-06 DIAGNOSIS — Z79899 Other long term (current) drug therapy: Secondary | ICD-10-CM | POA: Insufficient documentation

## 2012-06-06 DIAGNOSIS — Z8719 Personal history of other diseases of the digestive system: Secondary | ICD-10-CM | POA: Insufficient documentation

## 2012-06-06 DIAGNOSIS — J45909 Unspecified asthma, uncomplicated: Secondary | ICD-10-CM | POA: Insufficient documentation

## 2012-06-06 DIAGNOSIS — Z87891 Personal history of nicotine dependence: Secondary | ICD-10-CM | POA: Insufficient documentation

## 2012-06-06 DIAGNOSIS — F1120 Opioid dependence, uncomplicated: Secondary | ICD-10-CM

## 2012-06-06 LAB — CBC WITH DIFFERENTIAL/PLATELET
Basophils Absolute: 0 10*3/uL (ref 0.0–0.1)
Basophils Relative: 0 % (ref 0–1)
Eosinophils Absolute: 0 10*3/uL (ref 0.0–0.7)
Eosinophils Relative: 0 % (ref 0–5)
HCT: 45.3 % (ref 39.0–52.0)
Hemoglobin: 14.7 g/dL (ref 13.0–17.0)
Lymphocytes Relative: 21 % (ref 12–46)
Lymphs Abs: 2.7 10*3/uL (ref 0.7–4.0)
MCH: 26.4 pg (ref 26.0–34.0)
MCHC: 32.5 g/dL (ref 30.0–36.0)
MCV: 81.3 fL (ref 78.0–100.0)
Monocytes Absolute: 1 10*3/uL (ref 0.1–1.0)
Monocytes Relative: 8 % (ref 3–12)
Neutro Abs: 9.4 10*3/uL — ABNORMAL HIGH (ref 1.7–7.7)
Neutrophils Relative %: 71 % (ref 43–77)
Platelets: 418 10*3/uL — ABNORMAL HIGH (ref 150–400)
RBC: 5.57 MIL/uL (ref 4.22–5.81)
RDW: 14 % (ref 11.5–15.5)
WBC: 13.2 10*3/uL — ABNORMAL HIGH (ref 4.0–10.5)

## 2012-06-06 LAB — RAPID URINE DRUG SCREEN, HOSP PERFORMED
Amphetamines: NOT DETECTED
Barbiturates: NOT DETECTED
Benzodiazepines: POSITIVE — AB
Cocaine: NOT DETECTED
Opiates: NOT DETECTED
Tetrahydrocannabinol: NOT DETECTED

## 2012-06-06 NOTE — ED Notes (Signed)
Pt changed into scrubs, security notified to wand pt

## 2012-06-06 NOTE — ED Notes (Signed)
Wife has taken all pts belongings home.

## 2012-06-06 NOTE — ED Notes (Addendum)
Pt has history of narcotic addiction, had knee surgery in August, taking Oxycodone 15 mg. Dr Foy Guadalajara would like to be called after evaluation for Detox inpatient. Pt has taken 90 30 mg Oxycodone in 7 days, last pill was on Sunday

## 2012-06-07 ENCOUNTER — Encounter (HOSPITAL_COMMUNITY): Payer: Self-pay | Admitting: *Deleted

## 2012-06-07 ENCOUNTER — Inpatient Hospital Stay (HOSPITAL_COMMUNITY)
Admission: AD | Admit: 2012-06-07 | Discharge: 2012-06-12 | DRG: 744 | Disposition: A | Discharge status: Home / Self Care | Payer: BC Managed Care – PPO | Source: Ambulatory Visit | Attending: Emergency Medicine | Admitting: Emergency Medicine

## 2012-06-07 DIAGNOSIS — F19939 Other psychoactive substance use, unspecified with withdrawal, unspecified: Principal | ICD-10-CM | POA: Diagnosis present

## 2012-06-07 DIAGNOSIS — R42 Dizziness and giddiness: Secondary | ICD-10-CM | POA: Diagnosis present

## 2012-06-07 DIAGNOSIS — F1123 Opioid dependence with withdrawal: Secondary | ICD-10-CM | POA: Diagnosis present

## 2012-06-07 DIAGNOSIS — F1994 Other psychoactive substance use, unspecified with psychoactive substance-induced mood disorder: Secondary | ICD-10-CM | POA: Diagnosis present

## 2012-06-07 DIAGNOSIS — F1120 Opioid dependence, uncomplicated: Secondary | ICD-10-CM | POA: Diagnosis present

## 2012-06-07 DIAGNOSIS — F1310 Sedative, hypnotic or anxiolytic abuse, uncomplicated: Secondary | ICD-10-CM | POA: Diagnosis present

## 2012-06-07 DIAGNOSIS — J45909 Unspecified asthma, uncomplicated: Secondary | ICD-10-CM | POA: Diagnosis present

## 2012-06-07 DIAGNOSIS — Z79899 Other long term (current) drug therapy: Secondary | ICD-10-CM

## 2012-06-07 DIAGNOSIS — Z87891 Personal history of nicotine dependence: Secondary | ICD-10-CM

## 2012-06-07 DIAGNOSIS — F1110 Opioid abuse, uncomplicated: Secondary | ICD-10-CM | POA: Diagnosis present

## 2012-06-07 DIAGNOSIS — F1193 Opioid use, unspecified with withdrawal: Secondary | ICD-10-CM

## 2012-06-07 DIAGNOSIS — E860 Dehydration: Secondary | ICD-10-CM | POA: Diagnosis present

## 2012-06-07 DIAGNOSIS — Z96659 Presence of unspecified artificial knee joint: Secondary | ICD-10-CM

## 2012-06-07 DIAGNOSIS — R112 Nausea with vomiting, unspecified: Secondary | ICD-10-CM | POA: Diagnosis present

## 2012-06-07 DIAGNOSIS — F19239 Other psychoactive substance dependence with withdrawal, unspecified: Principal | ICD-10-CM | POA: Diagnosis present

## 2012-06-07 DIAGNOSIS — Z8719 Personal history of other diseases of the digestive system: Secondary | ICD-10-CM

## 2012-06-07 DIAGNOSIS — M1711 Unilateral primary osteoarthritis, right knee: Secondary | ICD-10-CM

## 2012-06-07 DIAGNOSIS — K219 Gastro-esophageal reflux disease without esophagitis: Secondary | ICD-10-CM | POA: Diagnosis present

## 2012-06-07 DIAGNOSIS — K900 Celiac disease: Secondary | ICD-10-CM | POA: Diagnosis present

## 2012-06-07 LAB — COMPREHENSIVE METABOLIC PANEL
ALT: 68 U/L — ABNORMAL HIGH (ref 0–53)
AST: 64 U/L — ABNORMAL HIGH (ref 0–37)
Albumin: 4.1 g/dL (ref 3.5–5.2)
Alkaline Phosphatase: 147 U/L — ABNORMAL HIGH (ref 39–117)
BUN: 11 mg/dL (ref 6–23)
CO2: 27 mEq/L (ref 19–32)
Calcium: 9.5 mg/dL (ref 8.4–10.5)
Chloride: 100 mEq/L (ref 96–112)
Creatinine, Ser: 0.98 mg/dL (ref 0.50–1.35)
GFR calc Af Amer: >90 mL/min (ref >90)
GFR calc non Af Amer: >90 mL/min (ref >90)
Glucose, Bld: 110 mg/dL — ABNORMAL HIGH (ref 70–99)
Potassium: 3.6 mEq/L (ref 3.5–5.1)
Sodium: 140 mEq/L (ref 135–145)
Total Bilirubin: 0.5 mg/dL (ref 0.3–1.2)
Total Protein: 8 g/dL (ref 6.0–8.3)

## 2012-06-07 LAB — ACETAMINOPHEN LEVEL: Acetaminophen (Tylenol), Serum: <15 ug/mL (ref 10–30)

## 2012-06-07 LAB — ETHANOL: Alcohol, Ethyl (B): <11 mg/dL (ref 0–11)

## 2012-06-07 MED ORDER — ALUM & MAG HYDROXIDE-SIMETH 200-200-20 MG/5ML PO SUSP: 30.0000 mL | ORAL | Status: DC | PRN

## 2012-06-07 MED ORDER — METHOCARBAMOL 500 MG PO TABS: 500.0000 mg | ORAL_TABLET | Freq: Three times a day (TID) | ORAL | Status: DC | PRN

## 2012-06-07 MED ORDER — TRAZODONE HCL 50 MG PO TABS
50.0000 mg | ORAL_TABLET | Freq: Every evening | ORAL | Status: DC | PRN
Start: 2012-06-07 — End: 2012-06-08
  Administered 2012-06-08: 50 mg via ORAL
  Filled 2012-06-07 (×5): qty 1

## 2012-06-07 MED ORDER — CLONIDINE HCL 0.1 MG PO TABS
0.1000 mg | ORAL_TABLET | ORAL | Status: AC
  Administered 2012-06-10 – 2012-06-11 (×3): 0.1 mg via ORAL
  Filled 2012-06-07 (×7): qty 1

## 2012-06-07 MED ORDER — CLONIDINE HCL 0.1 MG PO TABS
0.1000 mg | ORAL_TABLET | Freq: Every day | ORAL | Status: DC
  Administered 2012-06-12: 0.1 mg via ORAL
  Filled 2012-06-07 (×2): qty 1

## 2012-06-07 MED ORDER — NAPROXEN 500 MG PO TABS
500.0000 mg | ORAL_TABLET | Freq: Two times a day (BID) | ORAL | Status: DC | PRN
  Administered 2012-06-11 (×2): 500 mg via ORAL
  Filled 2012-06-07 (×2): qty 1

## 2012-06-07 MED ORDER — DICYCLOMINE HCL 20 MG PO TABS: 20.0000 mg | ORAL_TABLET | Freq: Four times a day (QID) | ORAL | Status: DC | PRN

## 2012-06-07 MED ORDER — LOPERAMIDE HCL 2 MG PO CAPS: 2.0000 mg | ORAL_CAPSULE | ORAL | Status: DC | PRN

## 2012-06-07 MED ORDER — NAPROXEN 500 MG PO TABS: 500.0000 mg | ORAL_TABLET | Freq: Two times a day (BID) | ORAL | Status: DC | PRN

## 2012-06-07 MED ORDER — CHLORDIAZEPOXIDE HCL 25 MG PO CAPS
25.0000 mg | ORAL_CAPSULE | Freq: Four times a day (QID) | ORAL | Status: DC | PRN
  Administered 2012-06-10 – 2012-06-11 (×2): 25 mg via ORAL
  Filled 2012-06-07 (×2): qty 1

## 2012-06-07 MED ORDER — MAGNESIUM HYDROXIDE 400 MG/5ML PO SUSP: 30.0000 mL | Freq: Every day | ORAL | Status: DC | PRN

## 2012-06-07 MED ORDER — ONDANSETRON 4 MG PO TBDP
4.0000 mg | ORAL_TABLET | Freq: Four times a day (QID) | ORAL | Status: DC | PRN
  Administered 2012-06-07 (×2): 4 mg via ORAL
  Filled 2012-06-07 (×2): qty 1

## 2012-06-07 MED ORDER — METHOCARBAMOL 500 MG PO TABS
500.0000 mg | ORAL_TABLET | Freq: Three times a day (TID) | ORAL | Status: DC | PRN
  Administered 2012-06-07 – 2012-06-12 (×5): 500 mg via ORAL
  Filled 2012-06-07 (×5): qty 1

## 2012-06-07 MED ORDER — HYDROXYZINE HCL 50 MG PO TABS
50.0000 mg | ORAL_TABLET | Freq: Every evening | ORAL | Status: DC | PRN
  Administered 2012-06-10 – 2012-06-12 (×3): 50 mg via ORAL

## 2012-06-07 MED ORDER — ACETAMINOPHEN 325 MG PO TABS
650.0000 mg | ORAL_TABLET | Freq: Four times a day (QID) | ORAL | Status: DC | PRN
Start: 2012-06-07 — End: 2012-06-12
  Administered 2012-06-08 – 2012-06-10 (×2): 650 mg via ORAL
  Filled 2012-06-07: qty 2

## 2012-06-07 MED ORDER — HYDROXYZINE HCL 25 MG PO TABS
25.0000 mg | ORAL_TABLET | Freq: Four times a day (QID) | ORAL | Status: DC | PRN
  Administered 2012-06-09: 25 mg via ORAL
  Filled 2012-06-07: qty 20

## 2012-06-07 MED ORDER — HYDROXYZINE HCL 25 MG PO TABS
25.0000 mg | ORAL_TABLET | Freq: Four times a day (QID) | ORAL | Status: DC | PRN
  Administered 2012-06-07 (×2): 25 mg via ORAL
  Filled 2012-06-07 (×2): qty 1

## 2012-06-07 MED ORDER — LORAZEPAM 1 MG PO TABS
1.0000 mg | ORAL_TABLET | Freq: Once | ORAL | Status: AC
  Administered 2012-06-07: 1 mg via ORAL
  Filled 2012-06-07: qty 1

## 2012-06-07 MED ORDER — CLONIDINE HCL 0.1 MG PO TABS
0.1000 mg | ORAL_TABLET | Freq: Four times a day (QID) | ORAL | Status: AC
  Administered 2012-06-07 – 2012-06-10 (×8): 0.1 mg via ORAL
  Filled 2012-06-07 (×12): qty 1

## 2012-06-07 NOTE — ED Notes (Signed)
Pt reports anxiety and needing medication to help him rest.  Pt given medication.

## 2012-06-07 NOTE — BHH Counselor (Signed)
Pt accepted to [facility], 306-1, Simon to [employer] MD. EDP and Rn notified. Support paperwork faxed to [facility] and originals placed in chart.

## 2012-06-07 NOTE — ED Notes (Signed)
Pt transported to behavioral health by [facility] security and NA with family.

## 2012-06-07 NOTE — Progress Notes (Signed)
Patient ID: Paul Roman, male   DOB: 1971-04-29, 41 y.o.   MRN: [id number]  D:  Pt laying in bed resting when the writer entered. Writer introduced self to patient and informed of program. Micah Flesher over report given by the prior nurse.  A: Offered support and encouragement. 15 min checks continued for safety.  R: Pt remains safe.

## 2012-06-07 NOTE — H&P (Signed)
Psychiatric Admission Assessment Adult  Patient Identification:  Paul Roman Date of Evaluation:  06/07/2012 Chief Complaint:  Opioid Dependence History of Present Illness:: Pt is a 41 y/o Wm accepted for opoid detox from the [facility] ED. Pt has been medically screened and cleared per [facility] EDP prior to transition too the [facility] hall. Pt has been using and dependent upon oxycodone at varying dosages and frquencys since 2009. Pt began taking the pain meds post L4-L5 fusion in 2009. Pt has been taking oxycodone since with a maximum daily doasge of 30mg  six times daily along with Oxycontin 20 mg tid. Pt has attempted self detox at home without success and endorses some guilt and hopelessness due to the fact that he still remains dependent uopn the medication.Pt also endorses erratic sleep patterns with a maximum of 3 - 4 hours daily.Pt rates his depressive sx a 3/10 but denies anhedonia, mood swings, crying spells, racing thoughts, anorexia, or unexplained weight loss. Pt also denies any AVH/HI/SI/SA or delusional thoughts. Pt also denies any panic attacks or hx of anxiety d/o. Patients notes good insight in regards to the consequences of his opiod dependence including recurring GI upset and relationship issues between he and his wife. Pt denies any h.o of prior psychiatric illness or prior hospitalizations. Pt does endorse a positive family hx of ETOH abuse.  Elements:  Severity:  escalating opiod dependence. Associated Signs/Synptoms: Depression Symptoms:  feelings of worthlessness/guilt, (Hypo) Manic Symptoms:  Non Manic Anxiety Symptoms:  None Psychotic Symptoms:  No psychosis PTSD Symptoms: Negative  Psychiatric Specialty Exam: Physical Exam  Constitutional: He appears well-developed and well-nourished.  Neck: Neck supple.  Cardiovascular: Normal rate and regular rhythm.   Skin: Skin is warm and dry.  Psychiatric: He has a normal mood and affect. His behavior is normal.    Review of Systems   Constitutional: Positive for fever, chills, malaise/fatigue and diaphoresis.  HENT: Negative for hearing loss, neck pain and tinnitus.   Eyes: Negative for blurred vision.  Cardiovascular: Negative for chest pain and palpitations.  Gastrointestinal: Positive for nausea, vomiting and abdominal pain.  Genitourinary: Negative for dysuria.  Musculoskeletal: Negative for myalgias.  Skin: Negative for itching and rash.  Neurological: Positive for dizziness, weakness and headaches. Negative for tremors, focal weakness, seizures and loss of consciousness.  Psychiatric/Behavioral: Positive for depression and substance abuse. Negative for suicidal ideas, hallucinations and memory loss. The patient is not nervous/anxious and does not have insomnia.     Blood pressure 92/65, pulse 120, temperature 98.2 F (36.8 C), temperature source Oral, resp. rate 16, height 5\' 9"  (1.753 m), weight 95.255 kg (210 lb).Body mass index is 31.01 kg/(m^2).  General Appearance: Fairly Groomed  Patent attorney::  Good  Speech:  Normal Rate  Volume:  Decreased  Mood:  Worthless  Affect:  Congruent  Thought Process:  Goal Directed  Orientation:  Full (Time, Place, and Person)  Thought Content:  Negative  Suicidal Thoughts:  No  Homicidal Thoughts:  No  Memory:  Recent;   Good  Judgement:  Fair  Insight:  Good  Psychomotor Activity:  Normal  Concentration:  Good  Recall:  Good  Akathisia:  Negative  Handed:  Right  AIMS (if indicated):     Assets:  Desire for Improvement  Sleep:       Past Psychiatric History: Diagnosis:  Hospitalizations:  Outpatient Care:  Substance Abuse Care:  Self-Mutilation:  Suicidal Attempts:  Violent Behaviors:   Past Medical History:   Past Medical History  Diagnosis Date  . Celiac disease   . Mitral valve prolapse   . Asthma     mild during peak allery season  . GERD (gastroesophageal reflux disease)   . Complication of anesthesia     was awake during EGD  . Shortness of  breath    None. Allergies:   Allergies  Allergen Reactions  . Ciprofloxacin Swelling  . Penicillins Rash  . Sulfa Antibiotics Rash  . Gluten Meal   . Glutethimides     Severe as stated by wife  . Montelukast Sodium    PTA Medications: Prescriptions prior to admission  Medication Sig Dispense Refill  . albuterol (PROVENTIL HFA;VENTOLIN HFA) 108 (90 BASE) MCG/ACT inhaler Inhale 2 puffs into the lungs every 6 (six) hours as needed. For shortness of breath      . ALPRAZolam (XANAX) 0.5 MG tablet Take 0.5 mg by mouth 3 (three) times daily as needed. For anxiety      . methocarbamol (ROBAXIN) 750 MG tablet Take 750 mg by mouth as needed. Take one tablet every 8 hrs as needed for spasm.      Marland Kitchen oxycodone (OXYCONTIN) 30 MG TB12 Take 15 mg by mouth every 12 (twelve) hours.      . promethazine (PHENERGAN) 25 MG tablet Take 25 mg by mouth every 6 (six) hours as needed. For nausea      . Testosterone (AXIRON TD) Place 1 application onto the skin daily.        Previous Psychotropic Medications:  Medication/Dose                 Substance Abuse History in the last 12 months:  no  Consequences of Substance Abuse: Medical Consequences:  dyspesia and multiple GI concerns  Social History:  reports that he has quit smoking. He has never used smokeless tobacco. He reports that he uses illicit drugs (Oxycodone). He reports that he does not drink alcohol. Additional Social History: History of alcohol / drug use?: Yes Name of Substance 1: oxycodone 1 - Age of First Use: 37 1 - Amount (size/oz): 6 30 mg pills daily 1 - Frequency: daily 1 - Duration: since 2009 1 - Last Use / Amount: 06/04/12                  Current Place of Residence:   Place of Birth:   Family Members: Marital Status:  Married Children:  Sons:  Daughters: Relationships: Education:  associates degree Educational Problems/Performance: Religious Beliefs/Practices: History of Abuse  (Emotional/Phsycial/Sexual) Occupational Experiences; Military History:  None. Legal History: Hobbies/Interests:  Family History:  History reviewed. No pertinent family history.  Results for orders placed during the hospital encounter of 06/06/12 (from the past 72 hour(s))  URINE RAPID DRUG SCREEN (HOSP PERFORMED)     Status: Abnormal   Collection Time   06/06/12 10:38 PM      Component Value Range Comment   Opiates NONE DETECTED  NONE DETECTED    Cocaine NONE DETECTED  NONE DETECTED    Benzodiazepines POSITIVE (*) NONE DETECTED    Amphetamines NONE DETECTED  NONE DETECTED    Tetrahydrocannabinol NONE DETECTED  NONE DETECTED    Barbiturates NONE DETECTED  NONE DETECTED   ACETAMINOPHEN LEVEL     Status: Normal   Collection Time   06/06/12 11:10 PM      Component Value Range Comment   Acetaminophen (Tylenol), Serum <15.0  10 - 30 ug/mL   CBC WITH DIFFERENTIAL     Status: Abnormal  Collection Time   06/06/12 11:10 PM      Component Value Range Comment   WBC 13.2 (*) 4.0 - 10.5 K/uL    RBC 5.57  4.22 - 5.81 MIL/uL    Hemoglobin 14.7  13.0 - 17.0 g/dL    HCT 87.5  64.3 - 32.9 %    MCV 81.3  78.0 - 100.0 fL    MCH 26.4  26.0 - 34.0 pg    MCHC 32.5  30.0 - 36.0 g/dL    RDW 51.8  84.1 - 66.0 %    Platelets 418 (*) 150 - 400 K/uL    Neutrophils Relative 71  43 - 77 %    Neutro Abs 9.4 (*) 1.7 - 7.7 K/uL    Lymphocytes Relative 21  12 - 46 %    Lymphs Abs 2.7  0.7 - 4.0 K/uL    Monocytes Relative 8  3 - 12 %    Monocytes Absolute 1.0  0.1 - 1.0 K/uL    Eosinophils Relative 0  0 - 5 %    Eosinophils Absolute 0.0  0.0 - 0.7 K/uL    Basophils Relative 0  0 - 1 %    Basophils Absolute 0.0  0.0 - 0.1 K/uL   COMPREHENSIVE METABOLIC PANEL     Status: Abnormal   Collection Time   06/06/12 11:10 PM      Component Value Range Comment   Sodium 140  135 - 145 mEq/L    Potassium 3.6  3.5 - 5.1 mEq/L    Chloride 100  96 - 112 mEq/L    CO2 27  19 - 32 mEq/L    Glucose, Bld 110 (*) 70 - 99  mg/dL    BUN 11  6 - 23 mg/dL    Creatinine, Ser 6.30  0.50 - 1.35 mg/dL    Calcium 9.5  8.4 - 16.0 mg/dL    Total Protein 8.0  6.0 - 8.3 g/dL    Albumin 4.1  3.5 - 5.2 g/dL    AST 64 (*) 0 - 37 U/L    ALT 68 (*) 0 - 53 U/L    Alkaline Phosphatase 147 (*) 39 - 117 U/L    Total Bilirubin 0.5  0.3 - 1.2 mg/dL    GFR calc non Af Amer >90  >90 mL/min    GFR calc Af Amer >90  >90 mL/min   ETHANOL     Status: Normal   Collection Time   06/06/12 11:10 PM      Component Value Range Comment   Alcohol, Ethyl (B) <11  0 - 11 mg/dL    Psychological Evaluations:  Assessment:   AXIS I:  Substance Abuse AXIS II:  No diagnosis AXIS III:  S/p Right TKR, Asthma, MVP, L4- L5 fusion, seasonal allergies Past Medical History  Diagnosis Date  . Celiac disease   . Mitral valve prolapse   . Asthma     mild during peak allery season  . GERD (gastroesophageal reflux disease)   . Complication of anesthesia     was awake during EGD  . Shortness of breath    AXIS IV:  other psychosocial or environmental problems AXIS V:  41-50 serious symptoms  Treatment Plan/Recommendations: 1)Admit for crises mgmt  safety and stabilization 2)Intensive in patient psychotherapy 3)Mgmt of chronic co morbidities 4)Social services to facilitate support services and aid in outpatient stabilization. 5)Psychotropic medical mgmt to reduce current symptoms to baseline and aid in future outpatient stabilization.  Treatment Plan Summary: Daily contact with patient to assess and evaluate symptoms and progress in treatment Medication management Current Medications:  Current Facility-Administered Medications  Medication Dose Route Frequency Provider Last Rate Last Dose  . acetaminophen (TYLENOL) tablet 650 mg  650 mg Oral Q6H PRN Cleotis Nipper, MD      . alum & mag hydroxide-simeth (MAALOX/MYLANTA) 200-200-20 MG/5ML suspension 30 mL  30 mL Oral Q4H PRN Cleotis Nipper, MD      . chlordiazePOXIDE (LIBRIUM) capsule 25 mg  25 mg  Oral Q6H PRN Cleotis Nipper, MD      . cloNIDine (CATAPRES) tablet 0.1 mg  0.1 mg Oral QID Cleotis Nipper, MD   0.1 mg at 06/07/12 2219   Followed by  . cloNIDine (CATAPRES) tablet 0.1 mg  0.1 mg Oral BH-qamhs Cleotis Nipper, MD       Followed by  . cloNIDine (CATAPRES) tablet 0.1 mg  0.1 mg Oral QAC breakfast Cleotis Nipper, MD      . dicyclomine (BENTYL) tablet 20 mg  20 mg Oral Q6H PRN Cleotis Nipper, MD      . hydrOXYzine (ATARAX/VISTARIL) tablet 25 mg  25 mg Oral Q6H PRN Cleotis Nipper, MD      . hydrOXYzine (ATARAX/VISTARIL) tablet 50 mg  50 mg Oral QHS PRN Cleotis Nipper, MD      . loperamide (IMODIUM) capsule 2-4 mg  2-4 mg Oral PRN Cleotis Nipper, MD      . magnesium hydroxide (MILK OF MAGNESIA) suspension 30 mL  30 mL Oral Daily PRN Cleotis Nipper, MD      . methocarbamol (ROBAXIN) tablet 500 mg  500 mg Oral Q8H PRN Cleotis Nipper, MD   500 mg at 06/07/12 2219  . naproxen (NAPROSYN) tablet 500 mg  500 mg Oral BID PRN Cleotis Nipper, MD      . traZODone (DESYREL) tablet 50 mg  50 mg Oral QHS,MR X 1 Kerry Hough, PA       Facility-Administered Medications Ordered in Other Encounters  Medication Dose Route Frequency Provider Last Rate Last Dose  . [COMPLETED] LORazepam (ATIVAN) tablet 1 mg  1 mg Oral Once Sunnie Nielsen, MD   1 mg at 06/07/12 0523  . [DISCONTINUED] dicyclomine (BENTYL) tablet 20 mg  20 mg Oral Q6H PRN Sunnie Nielsen, MD      . [DISCONTINUED] hydrOXYzine (ATARAX/VISTARIL) tablet 25 mg  25 mg Oral Q6H PRN Sunnie Nielsen, MD   25 mg at 06/07/12 1322  . [DISCONTINUED] loperamide (IMODIUM) capsule 2-4 mg  2-4 mg Oral PRN Sunnie Nielsen, MD      . [DISCONTINUED] methocarbamol (ROBAXIN) tablet 500 mg  500 mg Oral Q8H PRN Sunnie Nielsen, MD      . [DISCONTINUED] naproxen (NAPROSYN) tablet 500 mg  500 mg Oral BID PRN Sunnie Nielsen, MD      . [DISCONTINUED] ondansetron (ZOFRAN-ODT) disintegrating tablet 4 mg  4 mg Oral Q6H PRN Sunnie Nielsen, MD   4 mg at 06/07/12 1045    Observation Level/Precautions:   15 minute checks  Laboratory:  TSH, free T4  Psychotherapy:    Medications:    Consultations:    Discharge Concerns:    Estimated LOS:  Other:     I certify that inpatient services furnished can reasonably be expected to improve the patient's condition.   Karolyn Messing E 12/4/201311:39 PM

## 2012-06-07 NOTE — ED Notes (Signed)
Pt wondering about when he is going to [facility].  This Clinical research associate talked to the Child psychotherapist.  Social worker, Rene Kocher, will find out and call me back.

## 2012-06-07 NOTE — ED Notes (Signed)
Pt resting quietly watching tv. Family at bedside. Pt provided sprite per request.

## 2012-06-07 NOTE — Tx Team (Signed)
Initial Interdisciplinary Treatment Plan  PATIENT STRENGTHS: (choose at least two) Ability for insight Average or above average intelligence Capable of independent living Financial means General fund of knowledge Supportive family/friends  PATIENT STRESSORS: Health problems Substance abuse   PROBLEM LIST: Problem List/Patient Goals Date to be addressed Date deferred Reason deferred Estimated date of resolution  Depression    D/C  Substance Abuse    D/C                                             DISCHARGE CRITERIA:    PRELIMINARY DISCHARGE PLAN: Attend aftercare/continuing care group Attend 12-step recovery group  PATIENT/FAMIILY INVOLVEMENT: This treatment plan has been presented to and reviewed with the patient, TASHI BAND.  The patient and family have been given the opportunity to ask questions and make suggestions.  Leighton Parody M 06/07/2012, 6:22 PM

## 2012-06-07 NOTE — BH Assessment (Addendum)
Assessment Note   Paul Roman is a 41 y.o. male who presents for detox from opiates(Oxycodone).  Pt denies SI/HI/Psych.  Pt reports the following: pt was prescribed Oxycodone 30mg , TID in 2009 after back surgery due to a fall.  Pt also reports a total right knee replacement and was told by his PCP(Dr [facility]) to continue oxycodone for pain. Pt told this right he's unsure of how he injured his right knee.  Pt says he began to abuse the medication, taking 6 pills daily due to the increase in pain.  Pt was going to pain mgt clinic([facility]) but stopped going in 2009. Pt recently went to his physician and requested that pain med be decreased to 15mg  and pt.'s wife states 90 of the 15mg  pills are missing since 06/04/12.  Pt is experiencing w/d sxs: tremors, restless legs, body aches, cramps diarrhea, nausea, anxiety and fever/chills.  Pt last ingested any pain meds on 1201/13.  Pt denies any issues seizures/blackouts.  Pt says he went to [facility] ED some time ago for issues severe w/d was provided clonidine but this did not help.  Pt.'s spouse says pt also abuses Xanax, prescribed 0.5mg  TID, takes 5 pills daily, last use 06/06/12.  Pt is depressed because of SA issues, sadness due to issues being chronic.  This Clinical research associate called the following hospital for possible admission: [facility]--no current beds, may have d/c's in the am; [facility]---no male beds, unsure of d/c's today; [facility]--out of network for [facility] for detox, will only take dual dx(SI/HI).    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 Celiac disease   . Mitral valve prolapse   . Asthma     mild during peak allery season  . GERD (gastroesophageal reflux disease)   . Complication of anesthesia     was awake during EGD  . Shortness of breath    Axis IV: other psychosocial or environmental problems and problems related  to social environment Axis V: 41-50 serious symptoms  Past Medical History:  Past Medical History  Diagnosis Date  . Celiac disease   . Mitral valve prolapse   . Asthma     mild during peak allery season  . GERD (gastroesophageal reflux disease)   . Complication of anesthesia     was awake during EGD  . Shortness of breath     Past Surgical History  Procedure Date  . Knee arthroscopy     x2 R  . Back surgery 04/2007  . Tonsillectomy   . Knee arthroplasty 02/28/2012    Procedure: COMPUTER ASSISTED TOTAL KNEE ARTHROPLASTY;  Surgeon: Harvie Junior, MD;  Location: [facility] OR;  Service: Orthopedics;  Laterality: Right;  right total knee arthroplasty    Family History: No family history on file.  Social History:  reports that he has quit smoking. He has never used smokeless tobacco. He reports that he uses illicit drugs (Oxycodone). He reports that he does not drink alcohol.  Additional Social History:  Alcohol / Drug Use Pain Medications: See MAR  Prescriptions: See MAR  Over the Counter: See MAR  History of alcohol / drug use?: Yes Substance #1 Name of Substance 1: Oxycodone  1 - Age of First Use: 80 YOM  1 - Amount (size/oz): 6-30mg 's  1 - Frequency: Daily  1 - Duration: 4 Yrs  1 - Last Use / Amount: 06/06/12  CIWA: CIWA-Ar BP: 130/93 mmHg Pulse Rate: 81  COWS: Clinical Opiate Withdrawal Scale (COWS) Resting Pulse Rate: Pulse Rate 81-100 Sweating: Subjective report of chills or flushing Restlessness: Frequent shifting or extraneous movements of legs/arms Pupil Size: Pupils pinned or normal size for room light Bone or Joint Aches: Patient reports sever diffuse aching of joints/muscles Runny Nose or Tearing: Not present GI Upset: nausea or loose stool Tremor: Tremor can be felt, but not observed Yawning: No yawning Anxiety or Irritability: Patient obviously irritable/anxious Gooseflesh Skin: Skin is smooth COWS Total Score: 12   Allergies:  Allergies  Allergen  Reactions  . Ciprofloxacin Swelling  . Penicillins Rash  . Sulfa Antibiotics Rash  . Gluten Meal   . Glutethimides     Severe as stated by wife  . Montelukast Sodium     Home Medications:  (Not in a hospital admission)  OB/GYN Status:  No LMP for male patient.  General Assessment Data Location of Assessment: [facility] Living Arrangements: Spouse/significant other Can pt return to current living arrangement?: Yes Admission Status: Voluntary Is patient capable of signing voluntary admission?: Yes Transfer from: Acute Hospital Referral Source: MD  Education Status Is patient currently in school?: No Current Grade: None  Highest grade of school patient has completed: None  Name of school: None  Contact person: None   Risk to self Suicidal Ideation: No Suicidal Intent: No Is patient at risk for suicide?: No Suicidal Plan?: No Access to Means: No What has been your use of drugs/alcohol within the last 12 months?: Oxycodone  Previous Attempts/Gestures: No How many times?: 0  Other Self Harm Risks: None  Triggers for Past Attempts: None known Intentional Self Injurious Behavior: None Family Suicide History: No Recent stressful life event(s): Other (Comment) (Chronic pain ) Persecutory voices/beliefs?: No Depression: Yes Depression Symptoms: Loss of interest in usual pleasures Substance abuse history and/or treatment for substance abuse?: No Suicide prevention information given to non-admitted patients: Not applicable  Risk to Others Homicidal Ideation: No Thoughts of Harm to Others: No Current Homicidal Intent: No Current Homicidal Plan: No Access to Homicidal Means: No Identified Victim: None  History of harm to others?: No Assessment of Violence: None Noted Violent Behavior Description: None  Does patient have access to weapons?: No Criminal Charges Pending?: No Does patient have a court date: No  Psychosis Hallucinations: None noted Delusions: None  noted  Mental Status Report Appear/Hygiene: Other (Comment) (Appropriate ) Eye Contact: Poor Motor Activity: Unremarkable Speech: Logical/coherent Level of Consciousness: Quiet/awake Mood: Depressed Affect: Depressed Anxiety Level: None Thought Processes: Coherent;Relevant Judgement: Unimpaired Orientation: Person;Place;Time;Situation Obsessive Compulsive Thoughts/Behaviors: None  Cognitive Functioning Concentration: Normal Memory: Recent Intact;Remote Intact IQ: Average Insight: Fair Impulse Control: Fair Appetite: Good Weight Loss: 0  Weight Gain: 0  Sleep: No Change Total Hours of Sleep: 0  Vegetative Symptoms: None  ADLScreening [facility] Assessment Services) Patient's cognitive ability adequate to safely complete daily activities?: Yes Patient able to express need for assistance with ADLs?: Yes Independently performs ADLs?: Yes (appropriate for developmental age)  Abuse/Neglect [facility]) Physical Abuse: Denies Verbal Abuse: Denies Sexual Abuse: Denies  Prior Inpatient Therapy Prior Inpatient Therapy: No Prior Therapy Dates: None  Prior Therapy Facilty/Provider(s): None  Reason for Treatment: None   Prior Outpatient Therapy Prior Outpatient Therapy: No Prior Therapy Dates: None  Prior Therapy Facilty/Provider(s): None  Reason for Treatment: None   ADL Screening (condition at time of admission) Patient's cognitive ability adequate to safely complete daily activities?: Yes Patient able to express need  for assistance with ADLs?: Yes Independently performs ADLs?: Yes (appropriate for developmental age) Weakness of Legs: None Weakness of Arms/Hands: None  Home Assistive Devices/Equipment Home Assistive Devices/Equipment: None  Therapy Consults (therapy consults require a physician order) PT Evaluation Needed: No OT Evalulation Needed: No SLP Evaluation Needed: No Abuse/Neglect Assessment (Assessment to be complete while patient is alone) Physical Abuse:  Denies Verbal Abuse: Denies Sexual Abuse: Denies Exploitation of patient/patient's resources: Denies Self-Neglect: Denies Values / Beliefs Cultural Requests During Hospitalization: None Spiritual Requests During Hospitalization: None Consults Spiritual Care Consult Needed: No Social Work Consult Needed: No Merchant navy officer (For Healthcare) Advance Directive: Patient does not have advance directive;Patient would not like information Pre-existing out of facility DNR order (yellow form or pink MOST form): No Nutrition Screen- [facility] Patient's home diet: Regular Have you recently lost weight without trying?: No Have you been eating poorly because of a decreased appetite?: No Malnutrition Screening Tool Score: 0   Additional Information 1:1 In Past 12 Months?: No CIRT Risk: No Elopement Risk: No Does patient have medical clearance?: Yes     Disposition:  Disposition Disposition of Patient: Inpatient treatment program;Referred to [facility] ) Type of inpatient treatment program: Adult Patient referred to: Other (Comment) [facility] )  On Site Evaluation by:   Reviewed with Physician:     Murrell Redden 06/07/2012 4:52 AM

## 2012-06-07 NOTE — Progress Notes (Signed)
Patient ID: Paul Roman, male   DOB: 08-01-1970, 41 y.o.   MRN: [id number] Patient denies SI/HI and A/V hallucinations; patient came in requesting detox and states he has been taking six 30 mg tabs of oxycodone; patient started taking the oxycodone after a back surgery in 2009 and was continued after a total knee replacement in August; patient's family states that he has taken about 90 oxycodone in 7 days and his last use was 06/04/12; patient ETOH level was <11; and his urine drug screen was positive for benzodiazepines but he is prescribed xanax  ; patient has a history of celiac disease and is therefore on a gluten free diet and that has been ordered for him;

## 2012-06-07 NOTE — ED Provider Notes (Signed)
History     CSN: [id number]  Arrival date & time 06/06/12  2210   First MD Initiated Contact with Patient 06/06/12 2314      Chief Complaint  Patient presents with  . Medical Clearance    (Consider location/radiation/quality/duration/timing/severity/associated sxs/prior treatment) HPI Hx per PT and his wife. Requesting detox from opiates. Last use was Sunday morning 2 days ago. He has been taking OxyContin 30 mg sometime, be decreased to 50 mg and started going through withdrawals. Tonight his symptoms include chills, diarrhea and restlessness. He feels dehydrated. No fevers. Has some abdominal discomfort. He tried to stop on his own at one point he went to [facility] and was given outpatient referrals. Symptoms moderate in severity. He denies any self injury or suicidal ideation. No other drug use.  Past Medical History  Diagnosis Date  . Celiac disease   . Mitral valve prolapse   . Asthma     mild during peak allery season  . GERD (gastroesophageal reflux disease)   . Complication of anesthesia     was awake during EGD  . Shortness of breath     Past Surgical History  Procedure Date  . Knee arthroscopy     x2 R  . Back surgery 04/2007  . Tonsillectomy   . Knee arthroplasty 02/28/2012    Procedure: COMPUTER ASSISTED TOTAL KNEE ARTHROPLASTY;  Surgeon: Harvie Junior, MD;  Location: [facility] OR;  Service: Orthopedics;  Laterality: Right;  right total knee arthroplasty    No family history on file.  History  Substance Use Topics  . Smoking status: Former Games developer  . Smokeless tobacco: Never Used  . Alcohol Use: No      Review of Systems  Constitutional: Negative for fever and chills.  HENT: Negative for neck pain and neck stiffness.   Eyes: Negative for pain.  Respiratory: Negative for shortness of breath.   Cardiovascular: Negative for chest pain.  Gastrointestinal: Negative for abdominal pain.  Genitourinary: Negative for dysuria.  Musculoskeletal: Negative for back  pain.  Skin: Negative for rash.  Neurological: Negative for headaches.  Psychiatric/Behavioral: Negative for hallucinations and self-injury.  All other systems reviewed and are negative.    Allergies  Ciprofloxacin; Penicillins; Sulfa antibiotics; Gluten meal; Glutethimides; and Montelukast sodium  Home Medications   Current Outpatient Rx  Name  Route  Sig  Dispense  Refill  . ALBUTEROL SULFATE HFA 108 (90 BASE) MCG/ACT IN AERS   Inhalation   Inhale 2 puffs into the lungs every 6 (six) hours as needed. For shortness of breath         . ALPRAZOLAM 0.5 MG PO TABS   Oral   Take 0.5 mg by mouth 3 (three) times daily as needed. For anxiety         . METHOCARBAMOL 750 MG PO TABS   Oral   Take 750 mg by mouth as needed. Take one tablet every 8 hrs as needed for spasm.         . OXYCODONE HCL ER 30 MG PO TB12   Oral   Take 15 mg by mouth every 12 (twelve) hours.         Marland Kitchen PROMETHAZINE HCL 25 MG PO TABS   Oral   Take 25 mg by mouth every 6 (six) hours as needed. For nausea         . AXIRON TD   Transdermal   Place 1 application onto the skin daily.  BP 117/94  Pulse 106  Temp 98.1 F (36.7 C) (Oral)  Resp 18  SpO2 96%  Physical Exam  Constitutional: He is oriented to person, place, and time. He appears well-developed and well-nourished.  HENT:  Head: Normocephalic and atraumatic.  Eyes: Conjunctivae normal and EOM are normal. Pupils are equal, round, and reactive to light.  Neck: Trachea normal. Neck supple. No thyromegaly present.  Cardiovascular: Regular rhythm, S1 normal, S2 normal and normal pulses.     No systolic murmur is present   No diastolic murmur is present  Pulses:      Radial pulses are 2+ on the right side, and 2+ on the left side.       Mild tachycardia  Pulmonary/Chest: Effort normal and breath sounds normal. He has no wheezes. He has no rhonchi. He has no rales. He exhibits no tenderness.  Abdominal: Soft. Normal appearance  and bowel sounds are normal. There is no tenderness. There is no CVA tenderness and negative Murphy's sign.  Musculoskeletal:       Mae x 4  Neurological: He is alert and oriented to person, place, and time. He has normal strength. No cranial nerve deficit or sensory deficit. GCS eye subscore is 4. GCS verbal subscore is 5. GCS motor subscore is 6.  Skin: Skin is warm and dry. No rash noted. He is not diaphoretic.  Psychiatric: His speech is normal.       Cooperative and appropriate    ED Course  Procedures (including critical care time)  Results for orders placed during the hospital encounter of 06/06/12  ACETAMINOPHEN LEVEL      Component Value Range   Acetaminophen (Tylenol), Serum <15.0  10 - 30 ug/mL  CBC WITH DIFFERENTIAL      Component Value Range   WBC 13.2 (*) 4.0 - 10.5 K/uL   RBC 5.57  4.22 - 5.81 MIL/uL   Hemoglobin 14.7  13.0 - 17.0 g/dL   HCT 16.1  09.6 - 04.5 %   MCV 81.3  78.0 - 100.0 fL   MCH 26.4  26.0 - 34.0 pg   MCHC 32.5  30.0 - 36.0 g/dL   RDW 40.9  81.1 - 91.4 %   Platelets 418 (*) 150 - 400 K/uL   Neutrophils Relative 71  43 - 77 %   Neutro Abs 9.4 (*) 1.7 - 7.7 K/uL   Lymphocytes Relative 21  12 - 46 %   Lymphs Abs 2.7  0.7 - 4.0 K/uL   Monocytes Relative 8  3 - 12 %   Monocytes Absolute 1.0  0.1 - 1.0 K/uL   Eosinophils Relative 0  0 - 5 %   Eosinophils Absolute 0.0  0.0 - 0.7 K/uL   Basophils Relative 0  0 - 1 %   Basophils Absolute 0.0  0.0 - 0.1 K/uL  COMPREHENSIVE METABOLIC PANEL      Component Value Range   Sodium 140  135 - 145 mEq/L   Potassium 3.6  3.5 - 5.1 mEq/L   Chloride 100  96 - 112 mEq/L   CO2 27  19 - 32 mEq/L   Glucose, Bld 110 (*) 70 - 99 mg/dL   BUN 11  6 - 23 mg/dL   Creatinine, Ser 7.82  0.50 - 1.35 mg/dL   Calcium 9.5  8.4 - 95.6 mg/dL   Total Protein 8.0  6.0 - 8.3 g/dL   Albumin 4.1  3.5 - 5.2 g/dL   AST 64 (*) 0 - 37  U/L   ALT 68 (*) 0 - 53 U/L   Alkaline Phosphatase 147 (*) 39 - 117 U/L   Total Bilirubin 0.5  0.3  - 1.2 mg/dL   GFR calc non Af Amer >90  >90 mL/min   GFR calc Af Amer >90  >90 mL/min  ETHANOL      Component Value Range   Alcohol, Ethyl (B) <11  0 - 11 mg/dL  URINE RAPID DRUG SCREEN (HOSP PERFORMED)      Component Value Range   Opiates NONE DETECTED  NONE DETECTED   Cocaine NONE DETECTED  NONE DETECTED   Benzodiazepines POSITIVE (*) NONE DETECTED   Amphetamines NONE DETECTED  NONE DETECTED   Tetrahydrocannabinol NONE DETECTED  NONE DETECTED   Barbiturates NONE DETECTED  NONE DETECTED   ACT consult, d/w Aurther Loft, [facility] does not do inpatient opiate detox. She will call for other possible inpatient options.   1:59 AM Nursing Notes reviewed and I placed a page to Dr. Foy Guadalajara and spoke to one of his colleagues on call for him, and updated him as above on patient condition in plan at this point. ACT team and involved possible placement, otherwise discharge home with outpatient referrals. Behavioral health does not provide inpatient opiate detox  Clonidine protocol initiated.   4:55 AM per ACT, [facility] is reviewing patient at this time.  MDM  Opiate withdrawal requesting detox. Labs and UA reviewed. ACT consult. VS and nursing notes reviewed.         Sunnie Nielsen, MD 06/08/12 [phone]

## 2012-06-07 NOTE — ED Notes (Signed)
Pt given [place] 

## 2012-06-08 ENCOUNTER — Encounter (HOSPITAL_COMMUNITY): Payer: Self-pay | Admitting: Psychiatry

## 2012-06-08 DIAGNOSIS — F1110 Opioid abuse, uncomplicated: Secondary | ICD-10-CM

## 2012-06-08 DIAGNOSIS — F1310 Sedative, hypnotic or anxiolytic abuse, uncomplicated: Secondary | ICD-10-CM

## 2012-06-08 DIAGNOSIS — F1193 Opioid use, unspecified with withdrawal: Secondary | ICD-10-CM | POA: Diagnosis present

## 2012-06-08 DIAGNOSIS — F1123 Opioid dependence with withdrawal: Secondary | ICD-10-CM | POA: Diagnosis present

## 2012-06-08 DIAGNOSIS — F1994 Other psychoactive substance use, unspecified with psychoactive substance-induced mood disorder: Secondary | ICD-10-CM | POA: Diagnosis present

## 2012-06-08 MED ORDER — ONDANSETRON 4 MG PO TBDP
4.0000 mg | ORAL_TABLET | Freq: Three times a day (TID) | ORAL | Status: DC | PRN
  Administered 2012-06-09 – 2012-06-11 (×4): 4 mg via ORAL
  Filled 2012-06-08 (×3): qty 1

## 2012-06-08 MED ORDER — TRAZODONE HCL 100 MG PO TABS
100.0000 mg | ORAL_TABLET | Freq: Every evening | ORAL | Status: DC | PRN
  Administered 2012-06-08 – 2012-06-12 (×8): 100 mg via ORAL
  Filled 2012-06-08 (×12): qty 1

## 2012-06-08 MED ORDER — METOCLOPRAMIDE HCL 10 MG PO TABS
10.0000 mg | ORAL_TABLET | Freq: Four times a day (QID) | ORAL | Status: DC | PRN
  Administered 2012-06-08 (×2): 10 mg via ORAL
  Filled 2012-06-08 (×2): qty 1

## 2012-06-08 NOTE — Progress Notes (Signed)
D: Patient pleasant and cooperative with staff. He reported on self inventory sheet that his appetite is poor, energy level is low, and ability to pay attention is poor. Patient rated depression and feelings of hopelessness "3".  A: Support and encouragement provided to patient. Administered scheduled medications per MD orders. Monitor 15 minute checks for safety.   R: Patient receptive. Denies SI/HI. Patient remains safe.

## 2012-06-08 NOTE — Progress Notes (Signed)
Patient ID: Paul Roman, male   DOB: October 04, 1970, 41 y.o.   MRN: [id number] D: Patient in  room awake on approach. Pt presented with depressed mood and flat affect. Pt c/o knee pain from total knee replacement done in August 2013. Pt did not attend karaoke this evening. No acute distressed noted. Denies SI/HI/AV. Cooperative with assessment. Pt encouraged to come to staff with any question or concerns  A: Met with pt 1:1. Medications administered as prescribed. Safety has been maintained with Q15 minutes observation. Supported and encouragement provided to attend groups.  R: Patient remains safe. He is complaint with medications. Safety has been maintained Q15 and continue current POC.

## 2012-06-08 NOTE — Progress Notes (Signed)
Psychoeducational Group Note  Date:  06/08/2012 Time: 2000  Group Topic/Focus:  Karaoke Group  Participation Level:  Did Not Attend  Participation Quality:  N/A  Affect:  N/A  Cognitive:  N/A  Insight:  N/A   Engagement in Group:  N/A  Additional Comments:    Humberto Seals Monique 06/08/2012, 11:26 PM

## 2012-06-08 NOTE — [facility] Suicide Risk Assessment (Signed)
Suicide Risk Assessment  Admission Assessment     Nursing information obtained from:    Demographic factors:    Current Mental Status:    Loss Factors:    Historical Factors:    Risk Reduction Factors:     CLINICAL FACTORS:   Alcohol/Substance Abuse/Dependencies Chronic Pain  COGNITIVE FEATURES THAT CONTRIBUTE TO RISK: None identified   SUICIDE RISK:   Mild:  Suicidal ideation of limited frequency, intensity, duration, and specificity.  There are no identifiable plans, no associated intent, mild dysphoria and related symptoms, good self-control (both objective and subjective assessment), few other risk factors, and identifiable protective factors, including available and accessible social support.  PLAN OF CARE: Detox/reasses co morbidities/relapse prevention   Paul Roman A 06/08/2012, 9:20 AM

## 2012-06-08 NOTE — Clinical Social Work Note (Signed)
[facility] LCSW Aftercare Discharge Planning Group Note  06/08/2012 8:45 AM   Participation Quality: Appropriate, Attentive, Sharing and Supportive   Affect: Appropriate, Flat and Depressed   Cognitive: Alert and Appropriate   Insight: Engaged   Engagement in Group: Engaged   Modes of Intervention: Clarification, Discussion, Education, Exploration, Orientation, Problem-solving, Rapport Building, Socialization and Support   Summary of Progress/Problems: Pt attended discharge planning group and actively participated in group. CSW provided pt with today's workbook. Pt presents with flat affect and depressed mood.  Pt states that he came to the hospital to get help with getting off of pain pills.  Pt states that they are prescribed.  Pt states that he lives in [place] and can go back there upon d/c.  Pt states that he wants to follow up outpatient.  Pt denies having depression, anxiety and SI/HI.  CSW will assess for appropriate referrals.  No further needs voiced by pt at this time.    Reyes Ivan, LCSWA  06/08/2012 9:14 AM

## 2012-06-08 NOTE — H&P (Signed)
Psychiatric Admission Assessment Adult  Patient Identification:  Paul Roman Date of Evaluation:  06/08/2012 Chief Complaint:  Opioid Dependence History of Present Illness:: Has had issues with pain and use of opioids for years. 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 He was kept on opioids. He was prescribed 30 mg Oxycodone four per day. He was taking up to 6. Goes through withdrawal until he gets the refill. Was given Xanax for anxiety, 0.5 mg he takes one or two when he cant rest, but was running out early. He was pursuing physical therapy, but he was laid off. On the pills he tends  Elements:  Location:  Inpatient after transfer from ED. Quality:  Moderate opoid withdrawal, with moderate depression. Severity:  Depression worst over the last 4 weeks as increase use of opioids, benzodiazepnes. Timing:  Increasing as he ran out of opioids. Duration:  4 weeks. Context:  Affecting all areas of his life: physical health, relationship with family, work. Associated Signs/Synptoms: Withdrawal: nausea, diarrhea, aches, pains, shakes Depression Symptoms:  depressed mood,mostly when under the influence of the pills (Hypo) Manic Symptoms:  Denies Anxiety Symptoms:  Denies Psychotic Symptoms:  Denies PTSD Symptoms:Denies   Psychiatric Specialty Exam: Physical Exam  Constitutional: He appears well-nourished.  Psychiatric: Judgment and thought content normal. His affect is blunt. His speech is delayed. He is slowed and withdrawn. Cognition and memory are normal. He exhibits a depressed mood.    Review of Systems  Constitutional: Negative.   HENT: Negative.   Respiratory: Negative.   Cardiovascular: Negative.   Gastrointestinal: Positive for nausea, vomiting and diarrhea.  Genitourinary: Negative.   Musculoskeletal: Positive for myalgias, back pain and joint pain.  Skin: Negative.   Neurological: Positive for tremors.  Endo/Heme/Allergies: Negative.   Psychiatric/Behavioral: Positive  for depression and substance abuse.    Blood pressure 124/82, pulse 134, temperature 98.2 F (36.8 C), temperature source Oral, resp. rate 20, height 5\' 9"  (1.753 m), weight 95.255 kg (210 lb).Body mass index is 31.01 kg/(m^2).  General Appearance: Fairly Groomed  Patent attorney::  Fair  Speech:  Clear and Coherent, Slow and not spontanenous  Volume:  Decreased  Mood:  Depressed  Affect:  Restricted  Thought Process:  Coherent and Goal Directed  Orientation:  Full (Time, Place, and Person)  Thought Content:  His use of the opioids, his pain, his not being employed  Suicidal Thoughts:  No  Homicidal Thoughts:  No  Memory:  Immediate;   Fair Recent;   Fair Remote;   Fair  Judgement:  Intact  Insight:  Present  Psychomotor Activity:  Decreased  Concentration:  Fair  Recall:  Fair  Akathisia:  No  Handed:  Right  AIMS (if indicated):     Assets:  Desire for Improvement Housing Social Support Transportation Vocational/Educational  Sleep:  Number of Hours: 1.25     Past Psychiatric History: Diagnosis:Opioid, Benzodiazepine Abuse/substance induced mood disorder  Hospitalizations:Denies  Outpatient Care: Denies  Substance Abuse Care:Denies  Self-Mutilation:Denies  Suicidal Attempts:Denies  Violent Behaviors:Denies   Past Medical History:   Past Medical History  Diagnosis Date  . Celiac disease   . Mitral valve prolapse   . Asthma     mild during peak allery season  . GERD (gastroesophageal reflux disease)   . Complication of anesthesia     was awake during EGD  . Shortness of breath     Allergies:   Allergies  Allergen Reactions  . Ciprofloxacin Swelling  . Penicillins Rash  . Sulfa  Antibiotics Rash  . Gluten Meal   . Glutethimides     Severe as stated by wife  . Montelukast Sodium    PTA Medications: Prescriptions prior to admission  Medication Sig Dispense Refill  . albuterol (PROVENTIL HFA;VENTOLIN HFA) 108 (90 BASE) MCG/ACT inhaler Inhale 2 puffs into  the lungs every 6 (six) hours as needed. For shortness of breath      . ALPRAZolam (XANAX) 0.5 MG tablet Take 0.5 mg by mouth 3 (three) times daily as needed. For anxiety      . methocarbamol (ROBAXIN) 750 MG tablet Take 750 mg by mouth as needed. Take one tablet every 8 hrs as needed for spasm.      Marland Kitchen oxycodone (OXYCONTIN) 30 MG TB12 Take 15 mg by mouth every 12 (twelve) hours.      . promethazine (PHENERGAN) 25 MG tablet Take 25 mg by mouth every 6 (six) hours as needed. For nausea      . Testosterone (AXIRON TD) Place 1 application onto the skin daily.        Previous Psychotropic Medications:  Medication/Dose  Xanax 0.5 to take TID               Substance Abuse History in the last 12 months:  yes  Consequences of Substance Abuse: Withdrawal Symptoms:   Cramps Diaphoresis Diarrhea Nausea  Social History:  reports that he has quit smoking. He has never used smokeless tobacco. He reports that he uses illicit drugs (Oxycodone). He reports that he does not drink alcohol. Additional Social History: History of alcohol / drug use?: Yes Name of Substance 1: oxycodone 1 - Age of First Use: 37 1 - Amount (size/oz): 6 30 mg pills daily 1 - Frequency: daily 1 - Duration: since 2009 1 - Last Use / Amount: 06/04/12                  Current Place of Residence:  Lives with wife and kids Place of Birth:   Family Members: Marital Status:  Married Children:  Sons:15,12              Daughters:22 Relationships: Education:  2 years of college Educational Problems/Performance: Religious Beliefs/Practices: History of Abuse (Emotional/Phsycial/Sexual) Occupational Experiences; Pharmacist, 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 Legal History: Hobbies/Interests:  Family History:  History reviewed. No pertinent family history.  Results for orders placed during the hospital encounter of 06/06/12 (from the past 72 hour(s))  URINE RAPID DRUG SCREEN (HOSP  PERFORMED)     Status: Abnormal   Collection Time   06/06/12 10:38 PM      Component Value Range Comment   Opiates NONE DETECTED  NONE DETECTED    Cocaine NONE DETECTED  NONE DETECTED    Benzodiazepines POSITIVE (*) NONE DETECTED    Amphetamines NONE DETECTED  NONE DETECTED    Tetrahydrocannabinol NONE DETECTED  NONE DETECTED    Barbiturates NONE DETECTED  NONE DETECTED   ACETAMINOPHEN LEVEL     Status: Normal   Collection Time   06/06/12 11:10 PM      Component Value Range Comment   Acetaminophen (Tylenol), Serum <15.0  10 - 30 ug/mL   CBC WITH DIFFERENTIAL     Status: Abnormal   Collection Time   06/06/12 11:10 PM      Component Value Range Comment   WBC 13.2 (*) 4.0 - 10.5 K/uL    RBC 5.57  4.22 - 5.81 MIL/uL    Hemoglobin 14.7  13.0 - 17.0 g/dL    HCT 47.8  29.5 - 62.1 %    MCV 81.3  78.0 - 100.0 fL    MCH 26.4  26.0 - 34.0 pg    MCHC 32.5  30.0 - 36.0 g/dL    RDW 30.8  65.7 - 84.6 %    Platelets 418 (*) 150 - 400 K/uL    Neutrophils Relative 71  43 - 77 %    Neutro Abs 9.4 (*) 1.7 - 7.7 K/uL    Lymphocytes Relative 21  12 - 46 %    Lymphs Abs 2.7  0.7 - 4.0 K/uL    Monocytes Relative 8  3 - 12 %    Monocytes Absolute 1.0  0.1 - 1.0 K/uL    Eosinophils Relative 0  0 - 5 %    Eosinophils Absolute 0.0  0.0 - 0.7 K/uL    Basophils Relative 0  0 - 1 %    Basophils Absolute 0.0  0.0 - 0.1 K/uL   COMPREHENSIVE METABOLIC PANEL     Status: Abnormal   Collection Time   06/06/12 11:10 PM      Component Value Range Comment   Sodium 140  135 - 145 mEq/L    Potassium 3.6  3.5 - 5.1 mEq/L    Chloride 100  96 - 112 mEq/L    CO2 27  19 - 32 mEq/L    Glucose, Bld 110 (*) 70 - 99 mg/dL    BUN 11  6 - 23 mg/dL    Creatinine, Ser 9.62  0.50 - 1.35 mg/dL    Calcium 9.5  8.4 - 95.2 mg/dL    Total Protein 8.0  6.0 - 8.3 g/dL    Albumin 4.1  3.5 - 5.2 g/dL    AST 64 (*) 0 - 37 U/L    ALT 68 (*) 0 - 53 U/L    Alkaline Phosphatase 147 (*) 39 - 117 U/L    Total Bilirubin 0.5  0.3 - 1.2  mg/dL    GFR calc non Af Amer >90  >90 mL/min    GFR calc Af Amer >90  >90 mL/min   ETHANOL     Status: Normal   Collection Time   06/06/12 11:10 PM      Component Value Range Comment   Alcohol, Ethyl (B) <11  0 - 11 mg/dL    Psychological Evaluations:  Assessment:   AXIS I:  Opioid , Benzodiazepine Abuse, Substance Induced Mood Disorder AXIS II:  Deferred AXIS III:   Past Medical History  Diagnosis Date  . Celiac disease   . Mitral valve prolapse   . Asthma     mild during peak allery season  . GERD (gastroesophageal reflux disease)   . Complication of anesthesia     was awake during EGD  . Shortness of breath    AXIS IV:  economic problems and occupational problems AXIS V:  51-60 moderate symptoms  Treatment Plan/Recommendations:  Help with Detox, work a relapse prevention plan, work on Pharmacologist, relapse prevention, address comobdities  Treatment Plan Summary: Daily contact with patient to assess and evaluate symptoms and progress in treatment Medication management Current Medications:  Current Facility-Administered Medications  Medication Dose Route Frequency Provider Last Rate Last Dose  . acetaminophen (TYLENOL) tablet 650 mg  650 mg Oral Q6H PRN Cleotis Nipper, MD      . alum & mag hydroxide-simeth (MAALOX/MYLANTA) 200-200-20 MG/5ML suspension 30 mL  30 mL Oral  Q4H PRN Cleotis Nipper, MD      . chlordiazePOXIDE (LIBRIUM) capsule 25 mg  25 mg Oral Q6H PRN Cleotis Nipper, MD      . cloNIDine (CATAPRES) tablet 0.1 mg  0.1 mg Oral QID Cleotis Nipper, MD   0.1 mg at 06/08/12 0818   Followed by  . cloNIDine (CATAPRES) tablet 0.1 mg  0.1 mg Oral BH-qamhs Cleotis Nipper, MD       Followed by  . cloNIDine (CATAPRES) tablet 0.1 mg  0.1 mg Oral QAC breakfast Cleotis Nipper, MD      . dicyclomine (BENTYL) tablet 20 mg  20 mg Oral Q6H PRN Cleotis Nipper, MD      . hydrOXYzine (ATARAX/VISTARIL) tablet 25 mg  25 mg Oral Q6H PRN Cleotis Nipper, MD      . hydrOXYzine (ATARAX/VISTARIL)  tablet 50 mg  50 mg Oral QHS PRN Cleotis Nipper, MD      . loperamide (IMODIUM) capsule 2-4 mg  2-4 mg Oral PRN Cleotis Nipper, MD      . magnesium hydroxide (MILK OF MAGNESIA) suspension 30 mL  30 mL Oral Daily PRN Cleotis Nipper, MD      . methocarbamol (ROBAXIN) tablet 500 mg  500 mg Oral Q8H PRN Cleotis Nipper, MD   500 mg at 06/07/12 2219  . metoCLOPramide (REGLAN) tablet 10 mg  10 mg Oral Q6H PRN Kerry Hough, PA   10 mg at 06/08/12 1610  . naproxen (NAPROSYN) tablet 500 mg  500 mg Oral BID PRN Cleotis Nipper, MD      . traZODone (DESYREL) tablet 50 mg  50 mg Oral QHS,MR X 1 Kerry Hough, PA   50 mg at 06/08/12 9604   Facility-Administered Medications Ordered in Other Encounters  Medication Dose Route Frequency Provider Last Rate Last Dose  . [DISCONTINUED] dicyclomine (BENTYL) tablet 20 mg  20 mg Oral Q6H PRN Sunnie Nielsen, MD      . [DISCONTINUED] hydrOXYzine (ATARAX/VISTARIL) tablet 25 mg  25 mg Oral Q6H PRN Sunnie Nielsen, MD   25 mg at 06/07/12 1322  . [DISCONTINUED] loperamide (IMODIUM) capsule 2-4 mg  2-4 mg Oral PRN Sunnie Nielsen, MD      . [DISCONTINUED] methocarbamol (ROBAXIN) tablet 500 mg  500 mg Oral Q8H PRN Sunnie Nielsen, MD      . [DISCONTINUED] naproxen (NAPROSYN) tablet 500 mg  500 mg Oral BID PRN Sunnie Nielsen, MD      . [DISCONTINUED] ondansetron (ZOFRAN-ODT) disintegrating tablet 4 mg  4 mg Oral Q6H PRN Sunnie Nielsen, MD   4 mg at 06/07/12 1045    Observation Level/Precautions:  15 minute checks  Laboratory:  As per the ED  Psychotherapy:  Individual/group  Medications:  Clonidine detox/reassess for need of an antidepressant  Consultations:    Discharge Concerns:    Estimated LOS: 5-7 days  Other:     I certify that inpatient services furnished can reasonably be expected to improve the patient's condition.   Jian Hodgman A 12/5/20138:52 AM

## 2012-06-09 ENCOUNTER — Encounter (HOSPITAL_COMMUNITY): Payer: Self-pay | Admitting: *Deleted

## 2012-06-09 ENCOUNTER — Inpatient Hospital Stay (HOSPITAL_COMMUNITY): Payer: BC Managed Care – PPO

## 2012-06-09 LAB — CBC WITH DIFFERENTIAL/PLATELET
Basophils Absolute: 0 10*3/uL (ref 0.0–0.1)
Basophils Relative: 0 % (ref 0–1)
Eosinophils Absolute: 0 10*3/uL (ref 0.0–0.7)
Eosinophils Relative: 0 % (ref 0–5)
HCT: 42.4 % (ref 39.0–52.0)
Hemoglobin: 13.7 g/dL (ref 13.0–17.0)
Lymphocytes Relative: 24 % (ref 12–46)
Lymphs Abs: 2.4 10*3/uL (ref 0.7–4.0)
MCH: 25.7 pg — ABNORMAL LOW (ref 26.0–34.0)
MCHC: 32.3 g/dL (ref 30.0–36.0)
MCV: 79.5 fL (ref 78.0–100.0)
Monocytes Absolute: 0.8 10*3/uL (ref 0.1–1.0)
Monocytes Relative: 8 % (ref 3–12)
Neutro Abs: 6.8 10*3/uL (ref 1.7–7.7)
Neutrophils Relative %: 67 % (ref 43–77)
Platelets: 402 10*3/uL — ABNORMAL HIGH (ref 150–400)
RBC: 5.33 MIL/uL (ref 4.22–5.81)
RDW: 13.9 % (ref 11.5–15.5)
WBC: 10.2 10*3/uL (ref 4.0–10.5)

## 2012-06-09 LAB — COMPREHENSIVE METABOLIC PANEL
ALT: 35 U/L (ref 0–53)
AST: 28 U/L (ref 0–37)
Albumin: 3.7 g/dL (ref 3.5–5.2)
Alkaline Phosphatase: 121 U/L — ABNORMAL HIGH (ref 39–117)
BUN: 11 mg/dL (ref 6–23)
CO2: 26 mEq/L (ref 19–32)
Calcium: 8.9 mg/dL (ref 8.4–10.5)
Chloride: 96 mEq/L (ref 96–112)
Creatinine, Ser: 0.92 mg/dL (ref 0.50–1.35)
GFR calc Af Amer: >90 mL/min (ref >90)
GFR calc non Af Amer: >90 mL/min (ref >90)
Glucose, Bld: 108 mg/dL — ABNORMAL HIGH (ref 70–99)
Potassium: 3 mEq/L — ABNORMAL LOW (ref 3.5–5.1)
Sodium: 135 mEq/L (ref 135–145)
Total Bilirubin: 0.7 mg/dL (ref 0.3–1.2)
Total Protein: 7.2 g/dL (ref 6.0–8.3)

## 2012-06-09 LAB — MAGNESIUM: Magnesium: 2.4 mg/dL (ref 1.5–2.5)

## 2012-06-09 MED ORDER — SODIUM CHLORIDE 0.9 % IV BOLUS (SEPSIS)
1000.0000 mL | Freq: Once | INTRAVENOUS | Status: AC
  Administered 2012-06-09: 1000 mL via INTRAVENOUS

## 2012-06-09 MED ORDER — ACETAMINOPHEN 325 MG PO TABS
650.0000 mg | ORAL_TABLET | Freq: Once | ORAL | Status: AC
  Administered 2012-06-09: 650 mg via ORAL

## 2012-06-09 MED ORDER — POTASSIUM CHLORIDE 10 MEQ/100ML IV SOLN
10.0000 meq | INTRAVENOUS | Status: AC
  Administered 2012-06-09 (×2): 10 meq via INTRAVENOUS
  Filled 2012-06-09 (×2): qty 100

## 2012-06-09 MED ORDER — ONDANSETRON HCL 4 MG/2ML IJ SOLN
4.0000 mg | Freq: Once | INTRAMUSCULAR | Status: AC
  Administered 2012-06-09: 4 mg via INTRAVENOUS
  Filled 2012-06-09: qty 2

## 2012-06-09 MED ORDER — POTASSIUM CHLORIDE CRYS ER 20 MEQ PO TBCR
20.0000 meq | EXTENDED_RELEASE_TABLET | Freq: Every day | ORAL | Status: DC
  Administered 2012-06-09 – 2012-06-12 (×3): 20 meq via ORAL
  Filled 2012-06-09 (×5): qty 1

## 2012-06-09 MED ORDER — POTASSIUM CHLORIDE CRYS ER 20 MEQ PO TBCR
40.0000 meq | EXTENDED_RELEASE_TABLET | Freq: Once | ORAL | Status: AC
  Administered 2012-06-09: 40 meq via ORAL
  Filled 2012-06-09: qty 2

## 2012-06-09 NOTE — Progress Notes (Signed)
[facility] LCSW Aftercare Discharge Planning Group Note  06/09/2012 9:22 AM  Participation Quality:  Patient did not attend aftercare planning group   Paul Roman 06/09/2012, 9:22 AM

## 2012-06-09 NOTE — ED Notes (Addendum)
Pt reports he is admitted in [facility] for narcotic detox.  Was transported here today d/t constant nausea x 2 days, has not been able to eat or drink d/t the nausea.  Denies any abd pain or vomiting at this time.  Pt is A&Ox 4. VS WNL.  Sitter from [facility] at bedside.  Pt is calm and cooperative at this time.  Received report from a nurse in [facility], reported that pt might be dehydrated since pt has not eaten or drank x 2 days and may need IVF.

## 2012-06-09 NOTE — ED Notes (Signed)
Per EMS, pt transported from [facility].  Pt is a pt there.  Pt there for detox, has been having nausea and hasn't eaten x 2 days.  Pt is A&O x 4.  Pt denies abd pain but reports h/a at this time.

## 2012-06-09 NOTE — Progress Notes (Addendum)
D: Patient appropriate and cooperative with staff. Patient presents with blunted affect and depressed mood. He reported on self inventory sheet that his energy level is low and ability to pay attention is poor. Patient rated depression "2" and feelings of hopelessness "0".   A: Support and encouragement provided to patient. Scheduled medications administered per MD orders. Monitor Q15 minute checks for safety.   R: Patient receptive. Denies SI/HI/AVH. Patient remains safe on the unit.   12:45 pm MD made aware that patient has had a decreased in PO intake and stated "not feeling good today." Patient continued to c/o nausea and was given Zofran for nausea prior to my shift. Per patient the Zofran did not relieve the nausea. MD was made aware. Per MD's instructions, 911 was called and patient was transported via ambulance to [facility]. Tech, Tiara accompanied patient and report was called to [facility]. VS were stable when patient left unit and patient did not appear in any distress.  6:30 pm phoned [facility] to check on status of patient. Per charge nurse Ms. McCone, patient is being ruled out for a stroke and continues to be monitored in the ED. Charge nurse stated "patient appears dehydrated."

## 2012-06-09 NOTE — ED Notes (Signed)
Security called to transport pt back to [facility].

## 2012-06-09 NOTE — Tx Team (Signed)
Interdisciplinary Treatment Plan Update (Adult)  Date:  06/09/2012  Time Reviewed:  9:53 AM   Progress in Treatment: Attending groups: Yes Participating in groups:  Yes Taking medication as prescribed: Yes Tolerating medication:  Yes Family/Significant othe contact made:  CSW to call mother Patient understands diagnosis:  Yes Discussing patient identified problems/goals with staff:  Yes Medical problems stabilized or resolved:  Yes Denies suicidal/homicidal ideation: Yes Issues/concerns per patient self-inventory:  None identified Other: N/A  New problem(s) identified: None Identified  Reason for Continuation of Hospitalization: Anxiety Depression Medication stabilization  Interventions implemented related to continuation of hospitalization: mood stabilization, medication monitoring and adjustment, group therapy and psycho education, suicide risk assessment, collateral contact, aftercare planning, ongoing physician assessments and safety checks q 15 mins  Additional comments: N/A  Estimated length of stay: 3-5 days  Discharge Plan: CSW is assessing for appropriate referrals.  Possibly [facility]  New goal(s): N/A  Review of initial/current patient goals per problem list:    1.  Goal(s): Address substance use by completing detox protocol  Met:  No  Target date: 06/10/12  As evidenced by: completion of clonidine detox protocol  2.  Goal (s): Reduce depressive symptoms from a 10 to a 3  Met:  yes  Target date: 3-5 days  As evidenced by: Pt rates at a 2  3.  Goal (s): Reduce anxiety symptoms from a 10 to a 3  Met:  No  Target date:  3-5 days  As evidenced by: Pt rates at a    4.  Goal(s):  Met:  No  Target date:   As evidenced by:   Attendees: Patient:     Family:     Physician: Geoffery Lyons, MD 06/09/2012 9:53 AM   Nursing: Harold Barban, RN 06/09/2012 9:53 AM   Clinical Social Worker:  [place], LCSW 06/09/2012  9:53 AM    Other: Alease Frame, RN 06/09/2012  9:53 AM   Other:     Other:     Other:     Other:      Scribe for Treatment Team:   Reyes Ivan 06/09/2012 9:53 AM

## 2012-06-09 NOTE — Progress Notes (Signed)
Patient ID: Paul Roman, male   DOB: 1971/05/27, 41 y.o.   MRN: [id number] D: Patient returned from [facility] around 2030. Pt presented with depressed mood and blunted affect. Pt vital signs B/P 124/81, T 97.6, PR 91, SPO2 99, RR 18. No acute distressed noted. Pt stated he was able to eat two pieces of chicken @ ED. Per ED nurse pt potassium was 3.0, dehydrated and hypokalemic and needed potassium IV and PO. Pt denies SI/HI/AV and pain. Calm and cooperative with assessment. Pt encouraged to come to staff with any question or concerns  A: Met with pt 1:1. Vital signs taken. Medications administered as prescribed. Safety has been maintained with Q15 minutes observation. Supported and encouragement provided.  R: Patient remains safe.  He is complaint with medications. Pt resting in bed. Safety has been maintained Q15 and continue current POC.

## 2012-06-09 NOTE — Progress Notes (Signed)
[facility] MD Progress Note  06/09/2012 12:56 PM Paul Roman  MRN:  [id number] Subjective:  "feel lousy," has not been able to eat, still with nausea, (nausea medications are not helping) has not been able to ingest much fluid either. In bed, feels dizzy, admits to a headache. He is having knee pain. He admits that he is thirsty but seem not to be able to drink fluids. Diagnosis:  Opioid Dependence, Opioid Withdrawal, Benzodiazepine Abuse, Substance Induced Mood Disorder  ADL's:  Impaired  Sleep: Poor  Appetite:  Has not been able to eat due to the nausea  Suicidal Ideation:  Plan:  Denies Intent:  Denies Means:  Denies Homicidal Ideation:  Plan:  Denies Intent:  Denies Means:  Denies AEB (as evidenced by):  Psychiatric Specialty Exam: Review of Systems  Constitutional: Positive for malaise/fatigue.  HENT: Positive for neck pain.   Respiratory: Negative.   Cardiovascular: Negative.   Gastrointestinal: Positive for nausea and abdominal pain.  Genitourinary: Negative.   Musculoskeletal: Positive for myalgias and joint pain.  Skin: Negative.   Neurological: Positive for dizziness, weakness and headaches.  Endo/Heme/Allergies: Negative.   Psychiatric/Behavioral: Positive for substance abuse.    Blood pressure 125/87, pulse 85, temperature 98.5 F (36.9 C), temperature source Oral, resp. rate 18, height 5\' 9"  (1.753 m), weight 95.255 kg (210 lb).Body mass index is 31.01 kg/(m^2).  General Appearance: looks ill  Eye Contact::  Minimal  Speech:  Clear and Coherent, Slow and not spontaneous  Volume:  Decreased  Mood:  "feel lousy"  Affect:  Depressed and sick looking  Thought Process:  Coherent and Goal Directed  Orientation:  Full (Time, Place, and Person)  Thought Content:  symptoms  Suicidal Thoughts:  No  Homicidal Thoughts:  No  Memory:  Immediate;   Fair Recent;   Fair Remote;   Fair  Judgement:  Intact  Insight:  Present  Psychomotor Activity:  Decreased   Concentration:  Fair  Recall:  Fair  Akathisia:  No  Handed:  Right  AIMS (if indicated):     Assets:  Desire for Improvement Housing Social Support Transportation  Sleep:  Number of Hours: 3.5    Current Medications: Current Facility-Administered Medications  Medication Dose Route Frequency Provider Last Rate Last Dose  . acetaminophen (TYLENOL) tablet 650 mg  650 mg Oral Q6H PRN Cleotis Nipper, MD   650 mg at 06/08/12 1731  . alum & mag hydroxide-simeth (MAALOX/MYLANTA) 200-200-20 MG/5ML suspension 30 mL  30 mL Oral Q4H PRN Cleotis Nipper, MD      . chlordiazePOXIDE (LIBRIUM) capsule 25 mg  25 mg Oral Q6H PRN Cleotis Nipper, MD      . cloNIDine (CATAPRES) tablet 0.1 mg  0.1 mg Oral QID Cleotis Nipper, MD   0.1 mg at 06/08/12 2211   Followed by  . cloNIDine (CATAPRES) tablet 0.1 mg  0.1 mg Oral BH-qamhs Cleotis Nipper, MD       Followed by  . cloNIDine (CATAPRES) tablet 0.1 mg  0.1 mg Oral QAC breakfast Cleotis Nipper, MD      . dicyclomine (BENTYL) tablet 20 mg  20 mg Oral Q6H PRN Cleotis Nipper, MD      . hydrOXYzine (ATARAX/VISTARIL) tablet 25 mg  25 mg Oral Q6H PRN Cleotis Nipper, MD   25 mg at 06/09/12 0113  . hydrOXYzine (ATARAX/VISTARIL) tablet 50 mg  50 mg Oral QHS PRN Cleotis Nipper, MD      . loperamide (  IMODIUM) capsule 2-4 mg  2-4 mg Oral PRN Cleotis Nipper, MD      . magnesium hydroxide (MILK OF MAGNESIA) suspension 30 mL  30 mL Oral Daily PRN Cleotis Nipper, MD      . methocarbamol (ROBAXIN) tablet 500 mg  500 mg Oral Q8H PRN Cleotis Nipper, MD   500 mg at 06/08/12 2103  . metoCLOPramide (REGLAN) tablet 10 mg  10 mg Oral Q6H PRN Kerry Hough, PA   10 mg at 06/08/12 1704  . naproxen (NAPROSYN) tablet 500 mg  500 mg Oral BID PRN Cleotis Nipper, MD      . ondansetron (ZOFRAN-ODT) disintegrating tablet 4 mg  4 mg Oral Q8H PRN Rachael Fee, MD   4 mg at 06/09/12 [phone]  . traZODone (DESYREL) tablet 100 mg  100 mg Oral QHS,MR X 1 Rachael Fee, MD   100 mg at 06/08/12 2340    Lab  Results: No results found for this or any previous visit (from the past 48 hour(s)).  Physical Findings: AIMS: Facial and Oral Movements Muscles of Facial Expression: None, normal Lips and Perioral Area: None, normal Jaw: None, normal Tongue: None, normal,Extremity Movements Upper (arms, wrists, hands, fingers): None, normal Lower (legs, knees, ankles, toes): None, normal, Trunk Movements Neck, shoulders, hips: None, normal, Overall Severity Severity of abnormal movements (highest score from questions above): None, normal Incapacitation due to abnormal movements: None, normal Patient's awareness of abnormal movements (rate only patient's report): No Awareness, Dental Status Current problems with teeth and/or dentures?: No Does patient usually wear dentures?: No  CIWA:  CIWA-Ar Total: 1  COWS:  COWS Total Score: 10   Treatment Plan Summary: Daily contact with patient to assess and evaluate symptoms and progress in treatment Medication management  Plan: Dehydrated, will send to ED for hydration and further medical assessment and recomendations  Medical Decision Making Problem Points:  Established problem, worsening (2) and New problem, with additional work-up planned (4) Data Points:  Review or order clinical lab tests (1) Review of medication regiment & side effects (2)  I certify that inpatient services furnished can reasonably be expected to improve the patient's condition.   Kemani Demarais A 06/09/2012, 12:56 PM

## 2012-06-09 NOTE — ED Provider Notes (Signed)
CSN: [id number]  Arrival date & time 06/06/12 2210  First MD Initiated Contact with Patient 06/06/12 2314  Chief Complaint   Patient presents with nausea?dehydration     (Consider location/radiation/quality/duration/timing/severity/associated sxs/prior  treatment)     HPI  [facility] at [facility] of [facility]. Was taking 5-6 oxycodone 30 mg tablets a day for several months. Has been at behavioral health for the past several days. Since stopping opiate meds, notes persistent nausea and poor appetite, decreased po intake. Today was feeling lightheaded when standing. No syncope or loc. Denies abd pain. Has had recent nv, emesis resolved. Persistent nausea. No diarrhea or constipation. No gu c/o. No fever or chills. No cp or sob. No cough or uri c/o. Pt does note recent frontal headache, dull, moderate. No specific exacerbating or alleviating factors. No neck pain or stiffness. No eye pain or change in vision. No numbness/weakness. No sinus drainage or pain.      Past Medical History   Diagnosis  Date   .  Celiac disease    .  Mitral valve prolapse    .  Asthma      mild during peak allery season   .  GERD (gastroesophageal reflux disease)    .  Complication of anesthesia      was awake during EGD   .  Shortness of breath     Past Surgical History   Procedure  Date   .  Knee arthroscopy      x2 R   .  Back surgery  04/2007   .  Tonsillectomy    .  Knee arthroplasty  02/28/2012     Procedure: COMPUTER ASSISTED TOTAL KNEE ARTHROPLASTY; Surgeon: Harvie Junior, MD; Location: [facility] OR; Service: Orthopedics; Laterality: Right; right total knee arthroplasty   No family history on file.  History   Substance Use Topics   .  Smoking status:  Former Games developer   .  Smokeless tobacco:  Never Used   .  Alcohol Use:  No   Review of Systems  Constitutional: Negative for fever and chills.  HENT: Negative for neck pain and neck stiffness.  Eyes: Negative for pain.  Respiratory:  Negative for shortness of breath.  Cardiovascular: Negative for chest pain.  Gastrointestinal: Negative for abdominal pain.  Genitourinary: Negative for dysuria.  Musculoskeletal: Negative for back pain.  Skin: Negative for rash.  Neurological: dull headache, frontal on and off for past couple days. Dull. Moderate.   Psychiatric/Behavioral: Negative for hallucinations and self-injury.  All other systems reviewed and are negative.    Allergies   Ciprofloxacin; Penicillins; Sulfa antibiotics; Gluten meal; Glutethimides; and Montelukast sodium  Home Medications    Current Outpatient Rx   Name   Route   Sig   Dispense   Refill   .  ALBUTEROL SULFATE HFA 108 (90 BASE) MCG/ACT IN AERS   Inhalation   Inhale 2 puffs into the lungs every 6 (six) hours as needed. For shortness of breath       .  ALPRAZOLAM 0.5 MG PO TABS   Oral   Take 0.5 mg by mouth 3 (three) times daily as needed. For anxiety       .  METHOCARBAMOL 750 MG PO TABS   Oral   Take 750 mg by mouth as needed. Take one tablet every 8 hrs as needed for spasm.       .  OXYCODONE HCL ER 30 MG PO TB12   Oral  Take 15 mg by mouth every 12 (twelve) hours.       Marland Kitchen  PROMETHAZINE HCL 25 MG PO TABS   Oral   Take 25 mg by mouth every 6 (six) hours as needed. For nausea       .  AXIRON TD   Transdermal   Place 1 application onto the skin daily.           BP 117/94  Pulse 106  Temp 98.1 F (36.7 C) (Oral)  Resp 18  SpO2 96%  Physical Exam  Constitutional: He is oriented to person, place, and time. He appears well-developed and well-nourished.  HENT: atraumatic. No sinus or temporal tenderness.  Head: Normocephalic and atraumatic.  Eyes: Conjunctivae normal.Pupils are equal, round, and reactive to light.  Neck: Trachea normal. Neck supple. No thyromegaly present.  Cardiovascular: Regular rhythm, S1 normal, S2 normal and normal pulses.  No systolic murmur is present  No diastolic murmur is present  Pulses:  Radial pulses intact    Pulmonary/Chest: Effort normal and breath sounds normal. He has no wheezes. He has no rhonchi. He has no rales. He exhibits no tenderness.  Abdominal: Soft. Normal appearance and bowel sounds are normal. There is no tenderness. There is no CVA tenderness and negative Murphy's sign.  Musculoskeletal: Good rom bil ext. No edema. No focal bony tenderness.  Neurological: He is alert and oriented to person, place, and time. He has normal strength.  Skin: Skin is warm and dry. No rash noted. He is not diaphoretic.  Psychiatric: His speech is normal. Normal mood/affect      Cooperative and appropriate  ED Course   Procedures (including critical care time)  Results for orders placed during the hospital encounter of 06/07/12  CBC WITH DIFFERENTIAL      Component Value Range   WBC 10.2  4.0 - 10.5 K/uL   RBC 5.33  4.22 - 5.81 MIL/uL   Hemoglobin 13.7  13.0 - 17.0 g/dL   HCT 16.1  09.6 - 04.5 %   MCV 79.5  78.0 - 100.0 fL   MCH 25.7 (*) 26.0 - 34.0 pg   MCHC 32.3  30.0 - 36.0 g/dL   RDW 40.9  81.1 - 91.4 %   Platelets 402 (*) 150 - 400 K/uL   Neutrophils Relative 67  43 - 77 %   Neutro Abs 6.8  1.7 - 7.7 K/uL   Lymphocytes Relative 24  12 - 46 %   Lymphs Abs 2.4  0.7 - 4.0 K/uL   Monocytes Relative 8  3 - 12 %   Monocytes Absolute 0.8  0.1 - 1.0 K/uL   Eosinophils Relative 0  0 - 5 %   Eosinophils Absolute 0.0  0.0 - 0.7 K/uL   Basophils Relative 0  0 - 1 %   Basophils Absolute 0.0  0.0 - 0.1 K/uL  COMPREHENSIVE METABOLIC PANEL      Component Value Range   Sodium 135  135 - 145 mEq/L   Potassium 3.0 (*) 3.5 - 5.1 mEq/L   Chloride 96  96 - 112 mEq/L   CO2 26  19 - 32 mEq/L   Glucose, Bld 108 (*) 70 - 99 mg/dL   BUN 11  6 - 23 mg/dL   Creatinine, Ser 7.82  0.50 - 1.35 mg/dL   Calcium 8.9  8.4 - 95.6 mg/dL   Total Protein 7.2  6.0 - 8.3 g/dL   Albumin 3.7  3.5 - 5.2 g/dL   AST  28  0 - 37 U/L   ALT 35  0 - 53 U/L   Alkaline Phosphatase 121 (*) 39 - 117 U/L   Total Bilirubin 0.7   0.3 - 1.2 mg/dL   GFR calc non Af Amer >90  >90 mL/min   GFR calc Af Amer >90  >90 mL/min  MAGNESIUM      Component Value Range   Magnesium 2.4  1.5 - 2.5 mg/dL   Ct Head Wo Contrast  06/09/2012  *RADIOLOGY REPORT*  Clinical Data: Pain  CT HEAD WITHOUT CONTRAST  Technique:  Contiguous axial images were obtained from the base of the skull through the vertex without contrast.  Comparison: 11/07/2011  Findings: The brain has a normal appearance without evidence for hemorrhage, infarction, hydrocephalus, or mass lesion.  There is no extra axial fluid collection.  The skull and paranasal sinuses are normal.  IMPRESSION:  1.  No acute intracranial abnormalities.   Original Report Authenticated By: Signa Kell, M.D.       MDM   Iv ns bolus. zofran iv. Labs.   Reviewed nursing notes and prior charts for additional history.   k low, kcl po and iv.  Tylenol po for pain.   Additional ns iv.   abd soft nt.  Tolerating po fluids.   Pt feels much improved. No emesis in ed. Nausea improved. abd soft nt.   Recheck vitals normal. Pt appears stable to be moved back to [facility] for continued [facility]iate rehab/detox.   Suzi Roots, MD 06/09/12 1910

## 2012-06-10 DIAGNOSIS — F19939 Other psychoactive substance use, unspecified with withdrawal, unspecified: Principal | ICD-10-CM

## 2012-06-10 DIAGNOSIS — F19239 Other psychoactive substance dependence with withdrawal, unspecified: Principal | ICD-10-CM

## 2012-06-10 DIAGNOSIS — F1120 Opioid dependence, uncomplicated: Secondary | ICD-10-CM

## 2012-06-10 DIAGNOSIS — F1994 Other psychoactive substance use, unspecified with psychoactive substance-induced mood disorder: Secondary | ICD-10-CM

## 2012-06-10 MED ORDER — ENSURE COMPLETE PO LIQD
237.0000 mL | Freq: Two times a day (BID) | ORAL | Status: DC
  Administered 2012-06-10 – 2012-06-11 (×4): 237 mL via ORAL

## 2012-06-10 NOTE — Progress Notes (Signed)
[facility] MD Progress Note  06/10/2012 11:11 AM Paul Roman  MRN:  [id number] Subjective:  [name] feels that he is making slight progress. He continues to complain of opiate withdrawal symptoms, including weakness and malaise, body aches and joint pain, nausea, lack of appetite, and poor sleep. He denies any cravings. He rates his depression as a 4 on a scale of 1-10, 10 being the worst. He rates his anxiety as a 2 on a scale of 1-10. He denies any suicidal or homicidal ideation. He denies any auditory or visual hallucinations. He feels he was finally able to get to sleep about 4:30 this morning. He was able to eat an egg, piece of sausage, and a few pieces of fruit this morning.  Diagnosis:   Axis I: Opioid Dependence, Opioid Withdrawal, Benzodiazepine Abuse, Substance Induced Mood Disorder  Axis II: Deferred Axis III:  Past Medical History  Diagnosis Date  . Celiac disease   . Mitral valve prolapse   . Asthma     mild during peak allery season  . GERD (gastroesophageal reflux disease)   . Complication of anesthesia     was awake during EGD  . Shortness of breath     ADL's:  Impaired  Sleep: Poor  Appetite:  But improving  Suicidal Ideation:  Patient denies any thought, plan, or intent Homicidal Ideation:  Patient denies any thought, plan, or intent AEB (as evidenced by):  Psychiatric Specialty Exam: Review of Systems  Constitutional: Positive for malaise/fatigue.  HENT: Negative.   Eyes: Negative.   Respiratory: Negative.   Cardiovascular: Negative.   Gastrointestinal: Positive for nausea and abdominal pain. Negative for vomiting.  Genitourinary: Negative.   Musculoskeletal: Positive for myalgias and joint pain.  Skin: Negative.   Neurological: Positive for weakness.  Endo/Heme/Allergies: Negative.   Psychiatric/Behavioral: Positive for depression and substance abuse. Negative for suicidal ideas and hallucinations. The patient is nervous/anxious and has insomnia.      Blood pressure 120/73, pulse 91, temperature 98 F (36.7 C), temperature source Oral, resp. rate 18, height 5\' 9"  (1.753 m), weight 95.255 kg (210 lb), SpO2 99.00%.Body mass index is 31.01 kg/(m^2).  General Appearance: Disheveled  Eye Solicitor::  Fair  Speech:  Clear and Coherent  Volume:  Decreased  Mood:  Anxious and Depressed  Affect:  Congruent  Thought Process:  Linear  Orientation:  Full (Time, Place, and Person)  Thought Content:  WDL  Suicidal Thoughts:  No  Homicidal Thoughts:  No  Memory:  Immediate;   Good Recent;   Good Remote;   Good  Judgement:  Fair  Insight:  Fair  Psychomotor Activity:  Decreased  Concentration:  Good  Recall:  Good  Akathisia:  No  Handed:  Right  AIMS (if indicated):     Assets:  Communication Skills Desire for Improvement Housing Resilience  Sleep:  Number of Hours: 3    Current Medications: Current Facility-Administered Medications  Medication Dose Route Frequency Provider Last Rate Last Dose  . acetaminophen (TYLENOL) tablet 650 mg  650 mg Oral Q6H PRN Cleotis Nipper, MD   650 mg at 06/10/12 0127  . [COMPLETED] acetaminophen (TYLENOL) tablet 650 mg  650 mg Oral Once Suzi Roots, MD   650 mg at 06/09/12 1738  . alum & mag hydroxide-simeth (MAALOX/MYLANTA) 200-200-20 MG/5ML suspension 30 mL  30 mL Oral Q4H PRN Cleotis Nipper, MD      . chlordiazePOXIDE (LIBRIUM) capsule 25 mg  25 mg Oral Q6H PRN Cleotis Nipper,  MD      . [EXPIRED] cloNIDine (CATAPRES) tablet 0.1 mg  0.1 mg Oral QID Cleotis Nipper, MD   0.1 mg at 06/10/12 0750   Followed by  . cloNIDine (CATAPRES) tablet 0.1 mg  0.1 mg Oral BH-qamhs Cleotis Nipper, MD       Followed by  . cloNIDine (CATAPRES) tablet 0.1 mg  0.1 mg Oral QAC breakfast Cleotis Nipper, MD      . dicyclomine (BENTYL) tablet 20 mg  20 mg Oral Q6H PRN Cleotis Nipper, MD      . feeding supplement (ENSURE COMPLETE) liquid 237 mL  237 mL Oral BID BM Jorje Guild, PA-C      . hydrOXYzine (ATARAX/VISTARIL) tablet 25 mg   25 mg Oral Q6H PRN Cleotis Nipper, MD   25 mg at 06/09/12 0113  . hydrOXYzine (ATARAX/VISTARIL) tablet 50 mg  50 mg Oral QHS PRN Cleotis Nipper, MD   50 mg at 06/10/12 0026  . loperamide (IMODIUM) capsule 2-4 mg  2-4 mg Oral PRN Cleotis Nipper, MD      . magnesium hydroxide (MILK OF MAGNESIA) suspension 30 mL  30 mL Oral Daily PRN Cleotis Nipper, MD      . methocarbamol (ROBAXIN) tablet 500 mg  500 mg Oral Q8H PRN Cleotis Nipper, MD   500 mg at 06/08/12 2103  . metoCLOPramide (REGLAN) tablet 10 mg  10 mg Oral Q6H PRN Kerry Hough, PA   10 mg at 06/08/12 1704  . naproxen (NAPROSYN) tablet 500 mg  500 mg Oral BID PRN Cleotis Nipper, MD      . [COMPLETED] ondansetron [facility]) injection 4 mg  4 mg Intravenous Once Suzi Roots, MD   4 mg at 06/09/12 1653  . ondansetron (ZOFRAN-ODT) disintegrating tablet 4 mg  4 mg Oral Q8H PRN Rachael Fee, MD   4 mg at 06/10/12 0640  . [COMPLETED] potassium chloride 10 mEq in 100 mL IVPB  10 mEq Intravenous Q1 Hr x 2 Suzi Roots, MD   10 mEq at 06/09/12 1810  . potassium chloride SA (K-DUR,KLOR-CON) CR tablet 20 mEq  20 mEq Oral Daily Suzi Roots, MD   20 mEq at 06/09/12 1942  . [COMPLETED] potassium chloride SA (K-DUR,KLOR-CON) CR tablet 40 mEq  40 mEq Oral Once Suzi Roots, MD   40 mEq at 06/09/12 1653  . [COMPLETED] sodium chloride 0.9 % bolus 1,000 mL  1,000 mL Intravenous Once Suzi Roots, MD   1,000 mL at 06/09/12 1650  . [COMPLETED] sodium chloride 0.9 % bolus 1,000 mL  1,000 mL Intravenous Once Suzi Roots, MD   1,000 mL at 06/09/12 1811  . traZODone (DESYREL) tablet 100 mg  100 mg Oral QHS,MR X 1 Rachael Fee, MD   100 mg at 06/09/12 2256    Lab Results:  Results for orders placed during the hospital encounter of 06/07/12 (from the past 48 hour(s))  CBC WITH DIFFERENTIAL     Status: Abnormal   Collection Time   06/09/12  1:34 PM      Component Value Range Comment   WBC 10.2  4.0 - 10.5 K/uL    RBC 5.33  4.22 - 5.81 MIL/uL    Hemoglobin  13.7  13.0 - 17.0 g/dL    HCT 78.2  95.6 - 21.3 %    MCV 79.5  78.0 - 100.0 fL    MCH 25.7 (*) 26.0 -  34.0 pg    MCHC 32.3  30.0 - 36.0 g/dL    RDW 16.1  09.6 - 04.5 %    Platelets 402 (*) 150 - 400 K/uL    Neutrophils Relative 67  43 - 77 %    Neutro Abs 6.8  1.7 - 7.7 K/uL    Lymphocytes Relative 24  12 - 46 %    Lymphs Abs 2.4  0.7 - 4.0 K/uL    Monocytes Relative 8  3 - 12 %    Monocytes Absolute 0.8  0.1 - 1.0 K/uL    Eosinophils Relative 0  0 - 5 %    Eosinophils Absolute 0.0  0.0 - 0.7 K/uL    Basophils Relative 0  0 - 1 %    Basophils Absolute 0.0  0.0 - 0.1 K/uL   COMPREHENSIVE METABOLIC PANEL     Status: Abnormal   Collection Time   06/09/12  1:34 PM      Component Value Range Comment   Sodium 135  135 - 145 mEq/L    Potassium 3.0 (*) 3.5 - 5.1 mEq/L    Chloride 96  96 - 112 mEq/L    CO2 26  19 - 32 mEq/L    Glucose, Bld 108 (*) 70 - 99 mg/dL    BUN 11  6 - 23 mg/dL    Creatinine, Ser 4.09  0.50 - 1.35 mg/dL    Calcium 8.9  8.4 - 81.1 mg/dL    Total Protein 7.2  6.0 - 8.3 g/dL    Albumin 3.7  3.5 - 5.2 g/dL    AST 28  0 - 37 U/L    ALT 35  0 - 53 U/L    Alkaline Phosphatase 121 (*) 39 - 117 U/L    Total Bilirubin 0.7  0.3 - 1.2 mg/dL    GFR calc non Af Amer >90  >90 mL/min    GFR calc Af Amer >90  >90 mL/min   MAGNESIUM     Status: Normal   Collection Time   06/09/12  1:34 PM      Component Value Range Comment   Magnesium 2.4  1.5 - 2.5 mg/dL     Physical Findings: AIMS: Facial and Oral Movements Muscles of Facial Expression: None, normal Lips and Perioral Area: None, normal Jaw: None, normal Tongue: None, normal,Extremity Movements Upper (arms, wrists, hands, fingers): None, normal Lower (legs, knees, ankles, toes): None, normal, Trunk Movements Neck, shoulders, hips: None, normal, Overall Severity Severity of abnormal movements (highest score from questions above): None, normal Incapacitation due to abnormal movements: None, normal Patient's awareness  of abnormal movements (rate only patient's report): No Awareness, Dental Status Current problems with teeth and/or dentures?: No Does patient usually wear dentures?: No  CIWA:  CIWA-Ar Total: 2  COWS:  COWS Total Score: 5   Treatment Plan Summary: Daily contact with patient to assess and evaluate symptoms and progress in treatment Medication management We will order for him dietary supplements of Ensure and Gatorade. Otherwise we will continue his current plan of care, and followup planning  Plan:  Medical Decision Making Problem Points:  Established problem, stable/improving (1) and New problem, with no additional work-up planned (3) Data Points:  Review or order clinical lab tests (1) Review and summation of old records (2) Review of medication regiment & side effects (2)  I certify that inpatient services furnished can reasonably be expected to improve the patient's condition.   Seryna Marek 06/10/2012, 11:11 AM

## 2012-06-10 NOTE — Progress Notes (Signed)
D.  Pt pleasant and appropriate on approach, requested sleep medication, pleased to have some already ordered with a repeat if he needs this.  Denies SI/HI/hallucinations at this time.  Interacting appropriately within milieu.  Positive for evening AA group.  A.  Support and encouragement offered R.  Will continue to monitor.

## 2012-06-10 NOTE — Clinical Social Work Note (Signed)
   [facility] Group Notes: (Clinical Social Work)   06/10/2012  10-11am   Type of Therapy:  Group Therapy   Participation Level:  Did Not Attend    Ambrose Mantle, LCSW 06/10/2012, 11:05 AM

## 2012-06-11 MED ORDER — QUETIAPINE FUMARATE 50 MG PO TABS
50.0000 mg | ORAL_TABLET | Freq: Every day | ORAL | Status: DC
  Administered 2012-06-11: 50 mg via ORAL
  Filled 2012-06-11 (×2): qty 1

## 2012-06-11 NOTE — Progress Notes (Signed)
[facility] MD Progress Note  06/11/2012 12:16 PM Paul Roman  MRN:  [id number] Subjective:  Paul Roman continues to make slow progress. His main complaint is an inability to sleep at night. His nausea is diminishing slightly, and he is able to hold things down now. He continues to complain of body aches and joint pain, especially his knee. He denies any suicidal or homicidal ideation. He denies any auditory or visual hallucinations. He denies any depression today, but endorses some anxiety about going home. When asked to elaborate, he states that he would rather be home than here.   Diagnosis:   Axis I: Opioid Dependence, Opioid Withdrawal, Benzodiazepine Abuse, Substance Induced Mood Disorder  Axis II: Deferred Axis III:  Past Medical History  Diagnosis Date  . Celiac disease   . Mitral valve prolapse   . Asthma     mild during peak allery season  . GERD (gastroesophageal reflux disease)   . Complication of anesthesia     was awake during EGD  . Shortness of breath     ADL's:  Impaired  Sleep: Poor  Appetite:  Poor  Suicidal Ideation:  Patient denies any thought, plan, or intent Homicidal Ideation:  Patient denies any thought, plan, or intent AEB (as evidenced by):  Psychiatric Specialty Exam: Review of Systems  Constitutional: Positive for malaise/fatigue.  HENT: Negative.   Eyes: Negative.   Respiratory: Negative.   Cardiovascular: Negative.   Gastrointestinal: Positive for nausea and abdominal pain.  Genitourinary: Negative.   Musculoskeletal: Positive for myalgias and joint pain.  Skin: Negative.   Neurological: Negative.   Endo/Heme/Allergies: Negative.   Psychiatric/Behavioral: Positive for depression and substance abuse. The patient is nervous/anxious and has insomnia.     Blood pressure 128/91, pulse 83, temperature 97.9 F (36.6 C), temperature source Oral, resp. rate 16, height 5\' 9"  (1.753 m), weight 95.255 kg (210 lb), SpO2 99.00%.Body mass index is 31.01  kg/(m^2).  General Appearance: Disheveled  Eye Solicitor::  Fair  Speech:  Clear and Coherent  Volume:  Normal  Mood:  Depressed  Affect:  Congruent  Thought Process:  Logical  Orientation:  Full (Time, Place, and Person)  Thought Content:  WDL  Suicidal Thoughts:  No  Homicidal Thoughts:  No  Memory:  Immediate;   Good Recent;   Good Remote;   Good  Judgement:  Fair  Insight:  Fair  Psychomotor Activity:  Decreased  Concentration:  Good  Recall:  Good  Akathisia:  No  Handed:  Right  AIMS (if indicated):     Assets:  Communication Skills Desire for Improvement Social Support  Sleep:  Number of Hours: 0.5    Current Medications: Current Facility-Administered Medications  Medication Dose Route Frequency Provider Last Rate Last Dose  . acetaminophen (TYLENOL) tablet 650 mg  650 mg Oral Q6H PRN Cleotis Nipper, MD   650 mg at 06/10/12 0127  . alum & mag hydroxide-simeth (MAALOX/MYLANTA) 200-200-20 MG/5ML suspension 30 mL  30 mL Oral Q4H PRN Cleotis Nipper, MD      . chlordiazePOXIDE (LIBRIUM) capsule 25 mg  25 mg Oral Q6H PRN Cleotis Nipper, MD   25 mg at 06/11/12 0544  . cloNIDine (CATAPRES) tablet 0.1 mg  0.1 mg Oral BH-qamhs Cleotis Nipper, MD   0.1 mg at 06/11/12 0748   Followed by  . cloNIDine (CATAPRES) tablet 0.1 mg  0.1 mg Oral QAC breakfast Cleotis Nipper, MD      . dicyclomine (BENTYL) tablet 20 mg  20 mg Oral Q6H PRN Cleotis Nipper, MD      . feeding supplement (ENSURE COMPLETE) liquid 237 mL  237 mL Oral BID BM Jorje Guild, PA-C   237 mL at 06/11/12 0748  . hydrOXYzine (ATARAX/VISTARIL) tablet 25 mg  25 mg Oral Q6H PRN Cleotis Nipper, MD   25 mg at 06/09/12 0113  . hydrOXYzine (ATARAX/VISTARIL) tablet 50 mg  50 mg Oral QHS PRN Cleotis Nipper, MD   50 mg at 06/11/12 0008  . loperamide (IMODIUM) capsule 2-4 mg  2-4 mg Oral PRN Cleotis Nipper, MD      . magnesium hydroxide (MILK OF MAGNESIA) suspension 30 mL  30 mL Oral Daily PRN Cleotis Nipper, MD      . methocarbamol (ROBAXIN)  tablet 500 mg  500 mg Oral Q8H PRN Cleotis Nipper, MD   500 mg at 06/11/12 0007  . metoCLOPramide (REGLAN) tablet 10 mg  10 mg Oral Q6H PRN Kerry Hough, PA   10 mg at 06/08/12 1704  . naproxen (NAPROSYN) tablet 500 mg  500 mg Oral BID PRN Cleotis Nipper, MD   500 mg at 06/11/12 0007  . ondansetron (ZOFRAN-ODT) disintegrating tablet 4 mg  4 mg Oral Q8H PRN Rachael Fee, MD   4 mg at 06/11/12 0543  . potassium chloride SA (K-DUR,KLOR-CON) CR tablet 20 mEq  20 mEq Oral Daily Suzi Roots, MD   20 mEq at 06/11/12 0747  . traZODone (DESYREL) tablet 100 mg  100 mg Oral QHS,MR X 1 Rachael Fee, MD   100 mg at 06/10/12 2254    Lab Results:  Results for orders placed during the hospital encounter of 06/07/12 (from the past 48 hour(s))  CBC WITH DIFFERENTIAL     Status: Abnormal   Collection Time   06/09/12  1:34 PM      Component Value Range Comment   WBC 10.2  4.0 - 10.5 K/uL    RBC 5.33  4.22 - 5.81 MIL/uL    Hemoglobin 13.7  13.0 - 17.0 g/dL    HCT 96.0  45.4 - 09.8 %    MCV 79.5  78.0 - 100.0 fL    MCH 25.7 (*) 26.0 - 34.0 pg    MCHC 32.3  30.0 - 36.0 g/dL    RDW 11.9  14.7 - 82.9 %    Platelets 402 (*) 150 - 400 K/uL    Neutrophils Relative 67  43 - 77 %    Neutro Abs 6.8  1.7 - 7.7 K/uL    Lymphocytes Relative 24  12 - 46 %    Lymphs Abs 2.4  0.7 - 4.0 K/uL    Monocytes Relative 8  3 - 12 %    Monocytes Absolute 0.8  0.1 - 1.0 K/uL    Eosinophils Relative 0  0 - 5 %    Eosinophils Absolute 0.0  0.0 - 0.7 K/uL    Basophils Relative 0  0 - 1 %    Basophils Absolute 0.0  0.0 - 0.1 K/uL   COMPREHENSIVE METABOLIC PANEL     Status: Abnormal   Collection Time   06/09/12  1:34 PM      Component Value Range Comment   Sodium 135  135 - 145 mEq/L    Potassium 3.0 (*) 3.5 - 5.1 mEq/L    Chloride 96  96 - 112 mEq/L    CO2 26  19 - 32 mEq/L  Glucose, Bld 108 (*) 70 - 99 mg/dL    BUN 11  6 - 23 mg/dL    Creatinine, Ser 5.28  0.50 - 1.35 mg/dL    Calcium 8.9  8.4 - 41.3 mg/dL    Total  Protein 7.2  6.0 - 8.3 g/dL    Albumin 3.7  3.5 - 5.2 g/dL    AST 28  0 - 37 U/L    ALT 35  0 - 53 U/L    Alkaline Phosphatase 121 (*) 39 - 117 U/L    Total Bilirubin 0.7  0.3 - 1.2 mg/dL    GFR calc non Af Amer >90  >90 mL/min    GFR calc Af Amer >90  >90 mL/min   MAGNESIUM     Status: Normal   Collection Time   06/09/12  1:34 PM      Component Value Range Comment   Magnesium 2.4  1.5 - 2.5 mg/dL     Physical Findings: AIMS: Facial and Oral Movements Muscles of Facial Expression: None, normal Lips and Perioral Area: None, normal Jaw: None, normal Tongue: None, normal,Extremity Movements Upper (arms, wrists, hands, fingers): None, normal Lower (legs, knees, ankles, toes): None, normal, Trunk Movements Neck, shoulders, hips: None, normal, Overall Severity Severity of abnormal movements (highest score from questions above): None, normal Incapacitation due to abnormal movements: None, normal Patient's awareness of abnormal movements (rate only patient's report): No Awareness, Dental Status Current problems with teeth and/or dentures?: No Does patient usually wear dentures?: No  CIWA:  CIWA-Ar Total: 8  COWS:  COWS Total Score: 2   Treatment Plan Summary: Daily contact with patient to assess and evaluate symptoms and progress in treatment Medication management  Plan: We'll continue his current detox regimen. We will add Seroquel 50 mg at bedtime to target his insomnia. We'll continue to work on followup plans.  Medical Decision Making Problem Points:  Established problem, stable/improving (1), Review of last therapy session (1) and Review of psycho-social stressors (1) Data Points:  Review and summation of old records (2) Review of new medications or change in dosage (2)  I certify that inpatient services furnished can reasonably be expected to improve the patient's condition.   Gwendalynn Eckstrom 06/11/2012, 12:16 PM

## 2012-06-11 NOTE — Progress Notes (Signed)
Met with pt 1:1 who is blunted in affect with depressed mood. He reports pain remains a 6/10 even with naproxen admin. He does report absence of withdrawal symptoms at this time. Attended AA this evening. Support and encouragement given. Med ed provided about addition of seroquel for sleep. Pt denies SI/HI/AVH and is safe at this time. Lawrence Marseilles

## 2012-06-11 NOTE — Progress Notes (Signed)
Patient did attend the evening speaker AA meeting.  

## 2012-06-11 NOTE — Clinical Social Work Note (Signed)
[facility] Group Notes: (Clinical Social Work)   06/11/2012 10-11am   Type of Therapy:  Group Therapy   Participation Level:  Did Not Attend    Coraleigh Sheeran Grossman-Orr, LCSW 06/11/2012, 11:22 AM     

## 2012-06-11 NOTE — Progress Notes (Signed)
Patient ID: Paul Roman, male   DOB: [birth year], 41 y.o.   MRN: [id number] 06-11-12 @ 1238 nursing shift note: D: pt continues to make progress with regard to his opiate addiction. A: his cow was done at 0700 and it was a 2. He continues to have some mild n/v and zofran was administered. R:  will monitor this complaint of n/v. R: rn will continue to monitor and q 15 min cks continue.

## 2012-06-11 NOTE — Progress Notes (Addendum)
Patient ID: BRANNDON TUITE, male   DOB: 08/30/1970, 41 y.o.   MRN: [id number] Psychoeducational Group Note  Date:  06/11/2012 Time:  1315  Group Topic/Focus:  Making Healthy Choices:   The focus of this group is to help patients identify negative/unhealthy choices they were using prior to admission and identify positive/healthier coping strategies to replace them upon discharge.  Participation Level:  Did Not Attend  Participation Quality:    Affect:    Cognitive:   Insight:  Engagement in Group:  Additional Comments:  No rn group on that hall.   Valente David 06/11/2012,2:29 PM

## 2012-06-11 NOTE — Progress Notes (Signed)
Psychoeducational Group Note  Date:  06/11/2012 Time:  1000am  Group Topic/Focus:  Making Healthy Choices:   The focus of this group is to help patients identify negative/unhealthy choices they were using prior to admission and identify positive/healthier coping strategies to replace them upon discharge.  Participation Level:  Did Not Attend  Participation Quality:    Affect:Additional Comments: self inventory group-done by Wyn Forster, Eugenio Hoes 06/11/2012,10:08 AM

## 2012-06-12 MED ORDER — TESTOSTERONE 30 MG/ACT TD SOLN: 1.0000 "application " | Freq: Every day | TRANSDERMAL | Status: DC

## 2012-06-12 MED ORDER — TRAZODONE HCL 100 MG PO TABS: 100.0000 mg | ORAL_TABLET | Freq: Every evening | ORAL | Status: DC | PRN

## 2012-06-12 MED ORDER — LORATADINE 10 MG PO TABS: 10.0000 mg | ORAL_TABLET | Freq: Every day | ORAL | Status: AC

## 2012-06-12 MED ORDER — LORATADINE 10 MG PO TABS
10.0000 mg | ORAL_TABLET | Freq: Every day | ORAL | Status: DC
  Administered 2012-06-12: 10 mg via ORAL
  Filled 2012-06-12 (×3): qty 1

## 2012-06-12 MED ORDER — CLONIDINE HCL 0.1 MG PO TABS: 0.1000 mg | ORAL_TABLET | Freq: Every day | ORAL | Status: DC

## 2012-06-12 MED ORDER — HYDROXYZINE HCL 25 MG PO TABS: 25.0000 mg | ORAL_TABLET | Freq: Four times a day (QID) | ORAL | Status: DC | PRN

## 2012-06-12 MED ORDER — TESTOSTERONE 30 MG/ACT TD SOLN
1.0000 | Freq: Every day | TRANSDERMAL | Status: DC
Start: 2012-06-12 — End: 2012-06-12

## 2012-06-12 MED ORDER — QUETIAPINE FUMARATE 50 MG PO TABS: 50.0000 mg | ORAL_TABLET | Freq: Every day | ORAL | Status: DC

## 2012-06-12 MED ORDER — POTASSIUM CHLORIDE CRYS ER 20 MEQ PO TBCR: 20.0000 meq | EXTENDED_RELEASE_TABLET | Freq: Every day | ORAL | Status: DC

## 2012-06-12 NOTE — Progress Notes (Signed)
Patient did attend the evening speaker AA meeting. Pt wanted it reported that he showed up for the scheduled 1:15 group but it did not take place.

## 2012-06-12 NOTE — [facility] Suicide Risk Assessment (Signed)
Suicide Risk Assessment  Discharge Assessment     Demographic Factors:  Male and Caucasian  Mental Status Per Nursing Assessment::   On Admission:     Current Mental Status by Physician: Fully detox, in full contact with reality. There are no suicidal ideas, plans or intent. He is feeling much better. He is eating normally, reports no nausea. Will pursue outpatient treatment.  Loss Factors: Decline in physical health  Historical Factors: NA  Risk Reduction Factors:   Responsible for children under 51 years of age, Sense of responsibility to family, Employed, Living with another person, especially a relative and Positive social support  Continued Clinical Symptoms:  Depression:   Comorbid alcohol abuse/dependence  Cognitive Features That Contribute To Risk: No evidence   Suicide Risk:  Minimal: No identifiable suicidal ideation.  Patients presenting with no risk factors but with morbid ruminations; may be classified as minimal risk based on the severity of the depressive symptoms  Discharge Diagnoses:   AXIS I:  Opioid Dependence, S/P Opioid Withdrawal, substance induced mood disorder AXIS II:  Deferred AXIS III:   Past Medical History  Diagnosis Date  . Celiac disease   . Mitral valve prolapse   . Asthma     mild during peak allery season  . GERD (gastroesophageal reflux disease)   . Complication of anesthesia     was awake during EGD  . Shortness of breath    AXIS IV:  None identified AXIS V:  61-70 mild symptoms  Plan Of Care/Follow-up recommendations:  Activity:  As tolerated Diet:  Regular Follow up: [facility] Is patient on multiple antipsychotic therapies at discharge:  No   Has Patient had three or more failed trials of antipsychotic monotherapy by history:  No  Recommended Plan for Multiple Antipsychotic Therapies: N/A   Paul Roman A 06/12/2012, 1:46 PM

## 2012-06-12 NOTE — Progress Notes (Signed)
[facility] Adult Case Management Discharge Plan :  Will you be returning to the same living situation after discharge: Yes,    At discharge, do you have transportation home?:Yes,    Do you have the ability to pay for your medications:Yes,     Release of information consent forms completed and in the chart;  Patient's signature needed at discharge.  Patient to Follow up at: Follow-up Information    Follow up with [facility]. On 06/14/2012. (Appt scheduled for Wednesday, 06/14/12 at 11:00am  for initial intake appointment )    Contact information:   [address] [place], [place] [address] [phone] Fax [phone]         Patient denies SI/HI:   Yes,       Safety Planning and Suicide Prevention discussed:  Nino Glow 06/12/2012, 11:26 AM

## 2012-06-12 NOTE — Progress Notes (Signed)
Pt was discharged home today. He plans to go to CD-IOP downstairs here starting Wednesday.  He denied any S/H ideation or A/V hallucinations.  He denied need for smoking cessation material.  He stated," I do not smoke and neither does my wife"

## 2012-06-12 NOTE — Progress Notes (Addendum)
[facility] LCSW Aftercare Discharge Planning Group Note  06/12/2012 2:43 PM  Participation Quality:  Attentive  Affect:  Appropriate  Cognitive:  Alert  Insight:  patient discharging today ad agrees to go to CDIOP for follow up  Engagement in Group:  None  Modes of Intervention:  Discussion, Exploration and Problem-solving  Summary of Progress/Problems: Patient attend group today.  Discussed feeling better medically. Rated his depression and anxiety at a 0.  Denies thoughts of self harm.  Patient to start CDIOP on Wednesday. Safety planning and suicide prevention discussed.  Verna Czech [place] 06/12/2012, 2:43 PM

## 2012-06-14 NOTE — Progress Notes (Signed)
Patient Discharge Instructions:  Next Level Care Provider Has Access to the EMR, 06/14/12 Records provided to [facility] Charmian Muff via [facility]/Epic Access  Jerelene Redden, 06/14/2012, 1:13 PM

## 2012-06-14 NOTE — Discharge Summary (Signed)
Physician Discharge Summary Note  Patient:  Paul Roman is an 41 y.o., male MRN:  [id number] DOB:  28-Apr-1971 Patient phone:  [phone] (home)  Patient address:   [address] [place] [address],   Date of Admission:  06/07/2012 Date of Discharge: 06/12/2012  Reason for Admission:  Opioid dependency, depression, mood swings, racing thoughts  Discharge Diagnoses: Principal Problem:  *Opioid withdrawal Active Problems:  Opioid abuse  Benzodiazepine abuse  Substance induced mood disorder  Review of Systems  Constitutional: Negative.   HENT: Negative.   Eyes: Negative.   Respiratory: Negative.   Cardiovascular: Negative.   Gastrointestinal: Negative.   Genitourinary: Negative.   Musculoskeletal: Negative.   Skin: Negative.   Neurological: Negative.   Endo/Heme/Allergies: Negative.   Psychiatric/Behavioral: Positive for substance abuse.   Axis Diagnosis:   AXIS I:  Substance Abuse and Substance Induced Mood Disorder AXIS II:  Deferred AXIS III:   Past Medical History  Diagnosis Date  . Celiac disease   . Mitral valve prolapse   . Asthma     mild during peak allery season  . GERD (gastroesophageal reflux disease)   . Complication of anesthesia     was awake during EGD  . Shortness of breath    AXIS IV:  economic problems, other psychosocial or environmental problems, problems related to social environment and problems with primary support group AXIS V:  61-70 mild symptoms  Level of Care:  IOP  Hospital Course:  Patient was admitted for opioid dependency/detox.  He had knee surgery this past summer and has not been able to come off his oxycodone without withdrawal symptoms.  Chue also complained of erratic sleep, depression, mood swings, and racing thoughts--most likely contributed to opiate use.  Patient was placed on the COW protocol to detox off opiates--successfully managed.  Trazodone and Seroquel ordered for sleep with relief.  Clonidine and atarax  given for his anxiety and withdrawal.  Potassium ordered due to low K+ blood levels.  Patient discharged and followed-up with CD-IOP for further rehab treatment.  Consults:  None  Significant Diagnostic Studies:  labs: Completed and reviewed, stable  Discharge Vitals:   Blood pressure 120/78, pulse 88, temperature 97.6 F (36.4 C), temperature source Oral, resp. rate 24, height 5\' 9"  (1.753 m), weight 95.255 kg (210 lb), SpO2 99.00%. Body mass index is 31.01 kg/(m^2). Lab Results:   No results found for this or any previous visit (from the past 72 hour(s)).  Physical Findings: AIMS: Facial and Oral Movements Muscles of Facial Expression: None, normal Lips and Perioral Area: None, normal Jaw: None, normal Tongue: None, normal,Extremity Movements Upper (arms, wrists, hands, fingers): None, normal Lower (legs, knees, ankles, toes): None, normal, Trunk Movements Neck, shoulders, hips: None, normal, Overall Severity Severity of abnormal movements (highest score from questions above): None, normal Incapacitation due to abnormal movements: None, normal Patient's awareness of abnormal movements (rate only patient's report): No Awareness, Dental Status Current problems with teeth and/or dentures?: No Does patient usually wear dentures?: No  CIWA:  CIWA-Ar Total: 8  COWS:  COWS Total Score: 1   Psychiatric Specialty Exam: See Psychiatric Specialty Exam and Suicide Risk Assessment completed by Attending Physician prior to discharge.  Discharge destination:  Home  Is patient on multiple antipsychotic therapies at discharge:  No   Has Patient had three or more failed trials of antipsychotic monotherapy by history:  No Recommended Plan for Multiple Antipsychotic Therapies:  N/A  Discharge Orders    Future Orders Please Complete  By Expires   Diet - low sodium heart healthy      Activity as tolerated - No restrictions          Medication List     As of 06/14/2012  3:09 PM    STOP  taking these medications         ALPRAZolam 0.5 MG tablet   Commonly known as: XANAX      oxycodone 30 MG Tb12   Commonly known as: OXYCONTIN      TAKE these medications      Indication    albuterol 108 (90 BASE) MCG/ACT inhaler   Commonly known as: PROVENTIL HFA;VENTOLIN HFA   Inhale 2 puffs into the lungs every 6 (six) hours as needed. For shortness of breath       cloNIDine 0.1 MG tablet   Commonly known as: CATAPRES   Take 1 tablet (0.1 mg total) by mouth daily before breakfast.    Indication: Alcohol Withdrawal Syndrome      hydrOXYzine 25 MG tablet   Commonly known as: ATARAX/VISTARIL   Take 1 tablet (25 mg total) by mouth every 6 (six) hours as needed for anxiety.       loratadine 10 MG tablet   Commonly known as: CLARITIN   Take 1 tablet (10 mg total) by mouth daily.    Indication: Hayfever      methocarbamol 750 MG tablet   Commonly known as: ROBAXIN   Take 750 mg by mouth as needed. Take one tablet every 8 hrs as needed for spasm.       potassium chloride SA 20 MEQ tablet   Commonly known as: K-DUR,KLOR-CON   Take 1 tablet (20 mEq total) by mouth daily.    Indication: Low Amount of Potassium in the Blood      promethazine 25 MG tablet   Commonly known as: PHENERGAN   Take 25 mg by mouth every 6 (six) hours as needed. For nausea       QUEtiapine 50 MG tablet   Commonly known as: SEROQUEL   Take 1 tablet (50 mg total) by mouth at bedtime.    Indication: Trouble Sleeping      Testosterone 30 MG/ACT Soln   Place 1 application onto the skin daily.    Indication: Androgen Deficiency      traZODone 100 MG tablet   Commonly known as: DESYREL   Take 1 tablet (100 mg total) by mouth at bedtime and may repeat dose one time if needed.    Indication: Trouble Sleeping           Follow-up Information    Follow up with [facility]. On 06/14/2012. (Appt scheduled for Wednesday, 06/14/12 at 11:00am  for initial intake appointment )     Contact information:   [address] [place], [place] [address] [phone] Fax [phone]        Follow-up recommendations:  Activity as tolerated, low-sodium heart healthy diet  Comments:  Patient will follow-up with [facility]-IOP.  Total Discharge Time:  Greater than 30 minutes  Signed: Nanine Means, PMH-NP 06/14/2012, 3:09 PM

## 2012-06-19 ENCOUNTER — Other Ambulatory Visit: Payer: Self-pay | Admitting: Orthopedic Surgery

## 2012-06-20 NOTE — Discharge Summary (Signed)
Agree with assessment and plan Paul Roman A. Kami Kube, M.D. 

## 2012-07-27 NOTE — H&P (Signed)
Agree with assessment and plan Murel Wigle A. Surah Pelley, M.D. 

## 2012-11-30 ENCOUNTER — Other Ambulatory Visit: Payer: Self-pay

## 2012-11-30 ENCOUNTER — Encounter (HOSPITAL_BASED_OUTPATIENT_CLINIC_OR_DEPARTMENT_OTHER): Payer: Self-pay | Admitting: *Deleted

## 2012-11-30 ENCOUNTER — Emergency Department (HOSPITAL_BASED_OUTPATIENT_CLINIC_OR_DEPARTMENT_OTHER): Payer: Medicaid Other

## 2012-11-30 ENCOUNTER — Emergency Department (HOSPITAL_BASED_OUTPATIENT_CLINIC_OR_DEPARTMENT_OTHER)
Admission: EM | Admit: 2012-11-30 | Discharge: 2012-11-30 | Disposition: A | Discharge status: Home / Self Care | Payer: Medicaid Other | Attending: Emergency Medicine | Admitting: Emergency Medicine

## 2012-11-30 DIAGNOSIS — J45909 Unspecified asthma, uncomplicated: Secondary | ICD-10-CM | POA: Insufficient documentation

## 2012-11-30 DIAGNOSIS — Z79899 Other long term (current) drug therapy: Secondary | ICD-10-CM | POA: Insufficient documentation

## 2012-11-30 DIAGNOSIS — Z8719 Personal history of other diseases of the digestive system: Secondary | ICD-10-CM | POA: Insufficient documentation

## 2012-11-30 DIAGNOSIS — Z87891 Personal history of nicotine dependence: Secondary | ICD-10-CM | POA: Insufficient documentation

## 2012-11-30 DIAGNOSIS — Z8709 Personal history of other diseases of the respiratory system: Secondary | ICD-10-CM | POA: Insufficient documentation

## 2012-11-30 DIAGNOSIS — R079 Chest pain, unspecified: Secondary | ICD-10-CM

## 2012-11-30 DIAGNOSIS — Z8679 Personal history of other diseases of the circulatory system: Secondary | ICD-10-CM | POA: Insufficient documentation

## 2012-11-30 LAB — TROPONIN I: Troponin I: <0.3 ng/mL (ref <0.30)

## 2012-11-30 LAB — BASIC METABOLIC PANEL
BUN: 11 mg/dL (ref 6–23)
CO2: 25 mEq/L (ref 19–32)
Calcium: 9.1 mg/dL (ref 8.4–10.5)
Chloride: 105 mEq/L (ref 96–112)
Creatinine, Ser: 0.9 mg/dL (ref 0.50–1.35)
GFR calc Af Amer: >90 mL/min (ref >90)
GFR calc non Af Amer: >90 mL/min (ref >90)
Glucose, Bld: 118 mg/dL — ABNORMAL HIGH (ref 70–99)
Potassium: 4 mEq/L (ref 3.5–5.1)
Sodium: 140 mEq/L (ref 135–145)

## 2012-11-30 LAB — CBC WITH DIFFERENTIAL/PLATELET
Basophils Absolute: 0 10*3/uL (ref 0.0–0.1)
Basophils Relative: 0 % (ref 0–1)
Eosinophils Absolute: 0.2 10*3/uL (ref 0.0–0.7)
Eosinophils Relative: 2 % (ref 0–5)
HCT: 42.7 % (ref 39.0–52.0)
Hemoglobin: 13.8 g/dL (ref 13.0–17.0)
Lymphocytes Relative: 23 % (ref 12–46)
Lymphs Abs: 1.9 10*3/uL (ref 0.7–4.0)
MCH: 27.8 pg (ref 26.0–34.0)
MCHC: 32.3 g/dL (ref 30.0–36.0)
MCV: 86.1 fL (ref 78.0–100.0)
Monocytes Absolute: 0.6 10*3/uL (ref 0.1–1.0)
Monocytes Relative: 8 % (ref 3–12)
Neutro Abs: 5.5 10*3/uL (ref 1.7–7.7)
Neutrophils Relative %: 68 % (ref 43–77)
Platelets: 351 10*3/uL (ref 150–400)
RBC: 4.96 MIL/uL (ref 4.22–5.81)
RDW: 14 % (ref 11.5–15.5)
WBC: 8.2 10*3/uL (ref 4.0–10.5)

## 2012-11-30 MED ORDER — MORPHINE SULFATE 4 MG/ML IJ SOLN
6.0000 mg | Freq: Once | INTRAMUSCULAR | Status: AC
  Administered 2012-11-30: 6 mg via INTRAVENOUS
  Filled 2012-11-30: qty 2

## 2012-11-30 MED ORDER — KETOROLAC TROMETHAMINE 30 MG/ML IJ SOLN
30.0000 mg | Freq: Once | INTRAMUSCULAR | Status: AC
  Administered 2012-11-30: 30 mg via INTRAVENOUS
  Filled 2012-11-30: qty 1

## 2012-11-30 MED ORDER — ONDANSETRON HCL 4 MG/2ML IJ SOLN
4.0000 mg | Freq: Once | INTRAMUSCULAR | Status: AC
  Administered 2012-11-30: 4 mg via INTRAVENOUS
  Filled 2012-11-30: qty 2

## 2012-11-30 NOTE — ED Notes (Signed)
Pt c/o headache and requesting pain med for same. Orders received.

## 2012-11-30 NOTE — ED Notes (Signed)
TOOK ASA 650mg  this morning.

## 2012-11-30 NOTE — ED Notes (Signed)
Pt states he was at work yesterday and began having pain in left chest area denies any nausea diaphoresis or shortness of breath states he has felt tired and has had several episodes of diarrhea since the chest pain began yesterday. Pt also states he did have some left arm pain this morning. Denies any  Cough, fever or chills.  Pt states  He has been on metoprolol in the past for blood pressure but his cardiologist retired and hasnt followed up with anyone regarding blood pressure.

## 2012-12-07 NOTE — ED Provider Notes (Signed)
History     42 year old male with chest pain. Onset while at work yesterday. Pain is in the left chest and did not radiate first, but has noticed some left arm pain this morning as well. Patient has been constant since onset. No appreciable exacerbating relieving factors. For shortness of breath, nausea or diaphoresis. No unusual leg pain or swelling. No history of similar pain. No fevers or chills. No cough. No known history of coronary artery disease. No intervention prior to arrival.  CSN: [id number]   Arrival date & time 11/30/12  9147   First MD Initiated Contact with Patient 11/30/12 [phone]      Chief Complaint  Patient presents with  . Chest Pain    (Consider location/radiation/quality/duration/timing/severity/associated sxs/prior treatment) HPI  Past Medical History  Diagnosis Date  . Celiac disease   . Mitral valve prolapse   . Asthma     mild during peak allery season  . GERD (gastroesophageal reflux disease)   . Complication of anesthesia     was awake during EGD  . Shortness of breath     Past Surgical History  Procedure Laterality Date  . Knee arthroscopy      x2 R  . Back surgery  04/2007  . Tonsillectomy    . Knee arthroplasty  02/28/2012    Procedure: COMPUTER ASSISTED TOTAL KNEE ARTHROPLASTY;  Surgeon: Harvie Junior, MD;  Location: [facility] OR;  Service: Orthopedics;  Laterality: Right;  right total knee arthroplasty    History reviewed. No pertinent family history.  History  Substance Use Topics  . Smoking status: Former Games developer  . Smokeless tobacco: Never Used  . Alcohol Use: No     Comment: Pt abuses prescribed meds      Review of Systems  All systems reviewed and negative, other than as noted in HPI.   Allergies  Ciprofloxacin; Penicillins; Sulfa antibiotics; Gluten meal; Glutethimides; and Montelukast sodium  Home Medications   Current Outpatient Rx  Name  Route  Sig  Dispense  Refill  . albuterol (PROVENTIL HFA;VENTOLIN HFA) 108 (90 BASE)  MCG/ACT inhaler   Inhalation   Inhale 2 puffs into the lungs every 6 (six) hours as needed. For shortness of breath         . cloNIDine (CATAPRES) 0.1 MG tablet   Oral   Take 1 tablet (0.1 mg total) by mouth daily before breakfast.   1 tablet   0   . hydrOXYzine (ATARAX/VISTARIL) 25 MG tablet   Oral   Take 1 tablet (25 mg total) by mouth every 6 (six) hours as needed for anxiety.   30 tablet   0   . loratadine (CLARITIN) 10 MG tablet   Oral   Take 1 tablet (10 mg total) by mouth daily.   30 tablet   0   . methocarbamol (ROBAXIN) 750 MG tablet   Oral   Take 750 mg by mouth as needed. Take one tablet every 8 hrs as needed for spasm.         Marland Kitchen EXPIRED: potassium chloride SA (K-DUR,KLOR-CON) 20 MEQ tablet   Oral   Take 1 tablet (20 mEq total) by mouth daily.   6 tablet   0   . promethazine (PHENERGAN) 25 MG tablet   Oral   Take 25 mg by mouth every 6 (six) hours as needed. For nausea         . QUEtiapine (SEROQUEL) 50 MG tablet   Oral   Take 1 tablet (50 mg total)  by mouth at bedtime.   30 tablet   0   . Testosterone (AXIRON) 30 MG/ACT SOLN   Transdermal   Place 1 application onto the skin daily.   1 mL   0   . traZODone (DESYREL) 100 MG tablet   Oral   Take 1 tablet (100 mg total) by mouth at bedtime and may repeat dose one time if needed.   30 tablet   0     BP 129/80  Pulse 79  Temp(Src) 97.9 F (36.6 C) (Oral)  Resp 16  SpO2 100%  Physical Exam  Nursing note and vitals reviewed. Constitutional: He appears well-developed and well-nourished. No distress.  HENT:  Head: Normocephalic and atraumatic.  Eyes: Conjunctivae are normal. Right eye exhibits no discharge. Left eye exhibits no discharge.  Neck: Neck supple.  Cardiovascular: Normal rate, regular rhythm and normal heart sounds.  Exam reveals no gallop and no friction rub.   No murmur heard. Pulmonary/Chest: Effort normal and breath sounds normal. No respiratory distress. He exhibits no  tenderness.  Abdominal: Soft. He exhibits no distension. There is no tenderness.  Musculoskeletal: He exhibits no edema and no tenderness.  Lower extremities symmetric as compared to each other. No calf tenderness. Negative Homan's. No palpable cords.   Neurological: He is alert.  Skin: Skin is warm and dry.  Psychiatric: He has a normal mood and affect. His behavior is normal. Thought content normal.    ED Course  Procedures (including critical care time)  Labs Reviewed  BASIC METABOLIC PANEL - Abnormal; Notable for the following:    Glucose, Bld 118 (*)    All other components within normal limits  CBC WITH DIFFERENTIAL  TROPONIN I   Dg Chest 2 View  11/30/2012   *RADIOLOGY REPORT*  Clinical Data: Left-sided chest pain radiating to the left arm  CHEST - 2 VIEW  Comparison: 11/17/2011; 07/08/2011; chest CT - 11/17/2011  Findings: Grossly unchanged cardiac silhouette and mediastinal contours.  No focal airspace opacities.  No pleural effusion or pneumothorax.  No evidence of edema.  No acute osseous abnormality.  IMPRESSION: No acute cardiopulmonary disease.   Original Report Authenticated By: Tacey Ruiz, MD  No results found.  EKG:  Rhythm: Vent. rate 88 BPM PR interval 138 ms QRS duration 84 ms QT/QTc 368/445 ms ST segments: Normal normal sinus rhythm   1. Chest pain       MDM  42 year old male with chest pain. Somewhat concerning is radiation to the left are, but pain is generally atypical for ACS given constant nature for over 12 hours. EKG is unremarkable. Troponin is normal. Chest x-ray is clear. Patient is in no acute distress. Hemodynamically stable. Low suspicion for emergent etiology of his symptoms as his ACS, pulmonary embolism, infectious or aortic dissection. I feel he is stable for discharge at this time but discussed the need to followup with his PCP and stress testing within 72 hours.        Raeford Razor, MD 12/07/12 [phone]

## 2013-03-16 ENCOUNTER — Encounter: Payer: Medicaid Other | Admitting: Physical Medicine & Rehabilitation

## 2013-10-22 ENCOUNTER — Other Ambulatory Visit: Payer: Self-pay | Admitting: Physician Assistant

## 2013-10-22 DIAGNOSIS — R1011 Right upper quadrant pain: Secondary | ICD-10-CM

## 2013-10-23 ENCOUNTER — Ambulatory Visit (INDEPENDENT_AMBULATORY_CARE_PROVIDER_SITE_OTHER): Payer: BC Managed Care – PPO

## 2013-10-23 DIAGNOSIS — R1011 Right upper quadrant pain: Secondary | ICD-10-CM

## 2014-05-28 IMAGING — US US ABDOMEN COMPLETE
1 series · 14 of 25 positions shown · non-contrast
Comparison: CT ABD/PELVIS W CM dated 04/01/2011

CLINICAL DATA: Right upper quadrant pain. Epigastric pain for 2
weeks.

EXAM:
ULTRASOUND ABDOMEN COMPLETE

[Series 1: us abdomen complete · 0.35mm/px · 14 of 68 slices shown]
[im 1/68]
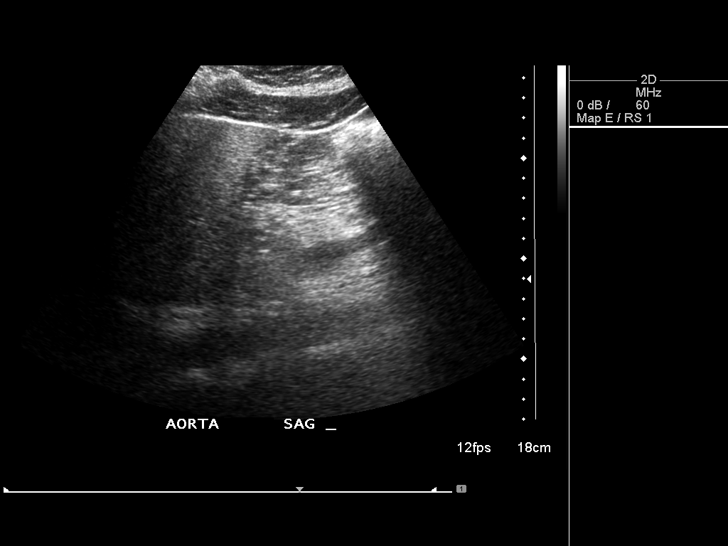
[im 6/68]
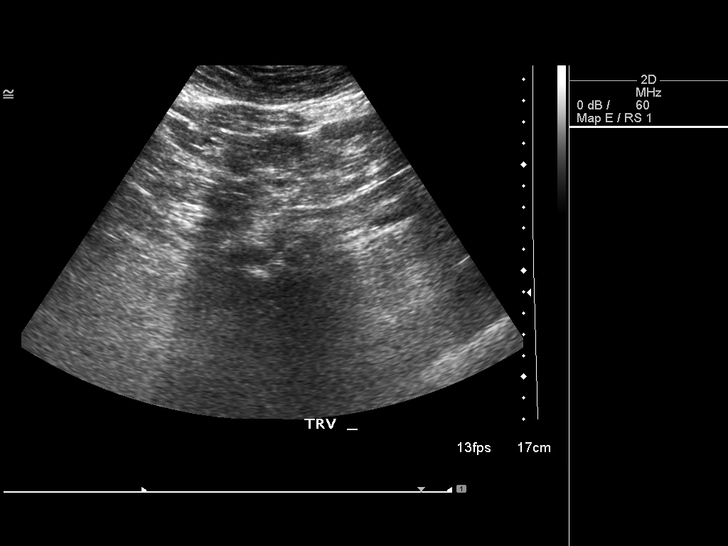
[im 12/68]
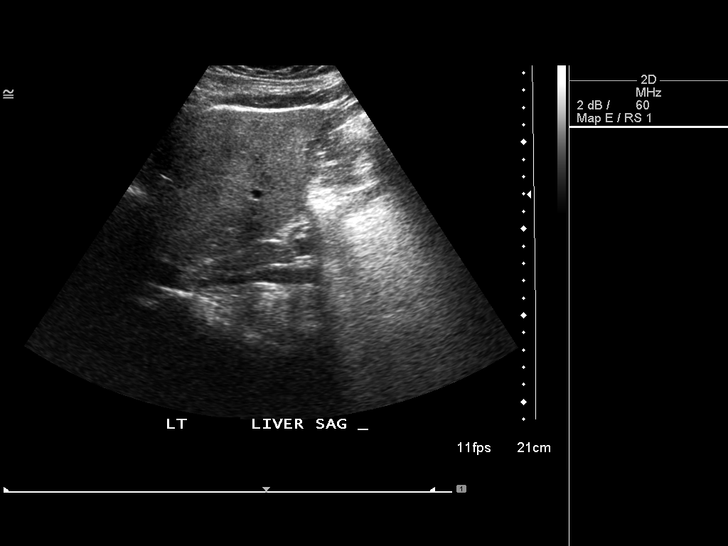
[im 17/68]
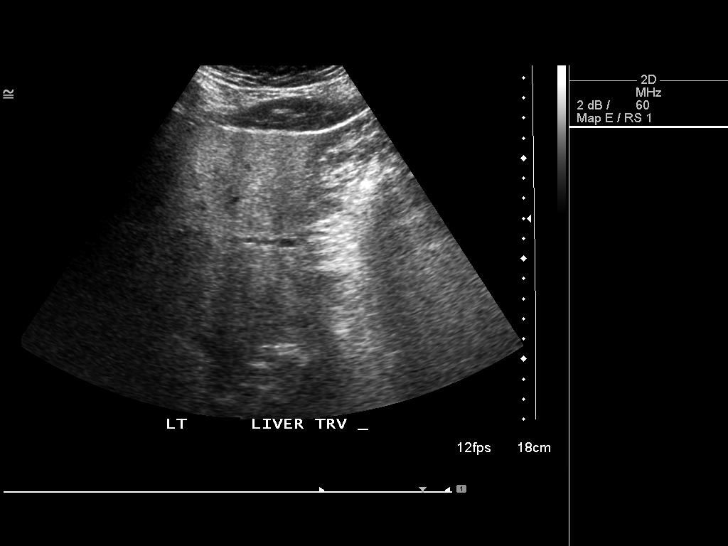
[im 23/68]
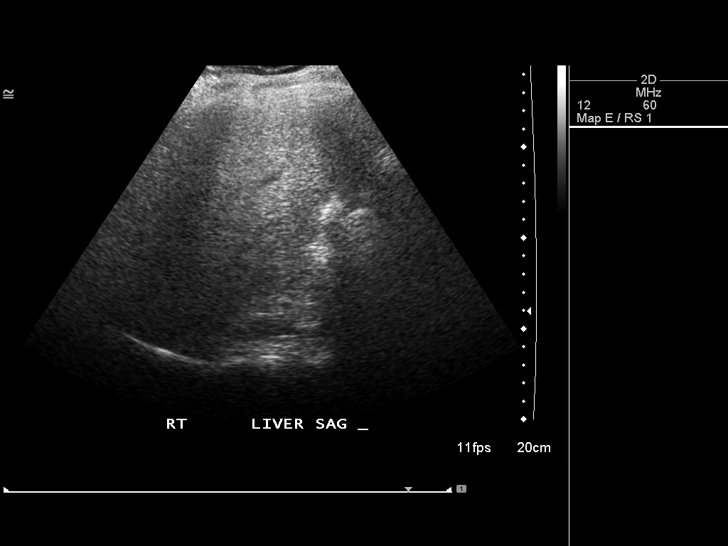
[im 26/68]
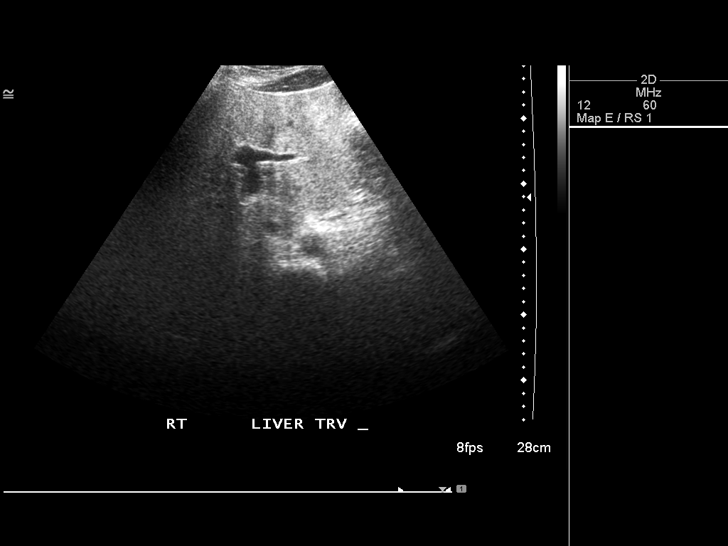
[im 31/68]
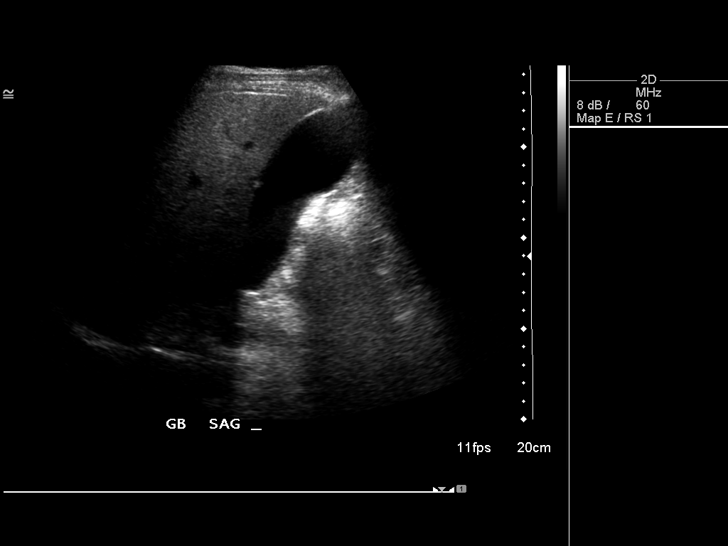
[im 37/68]
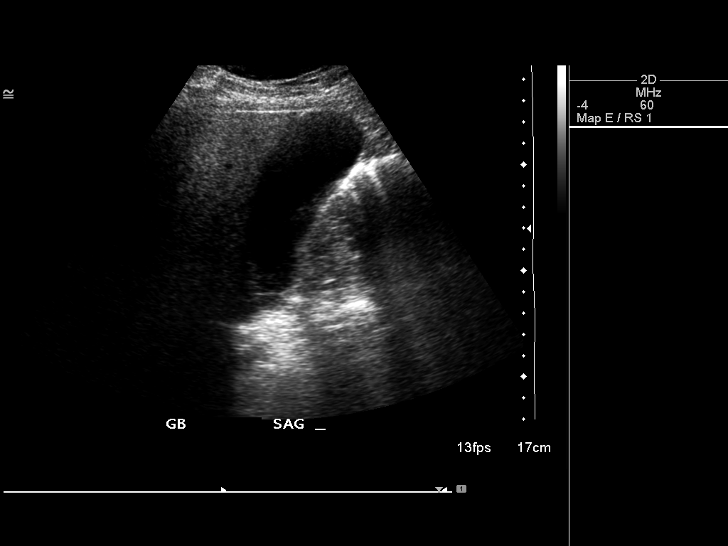
[im 42/68]
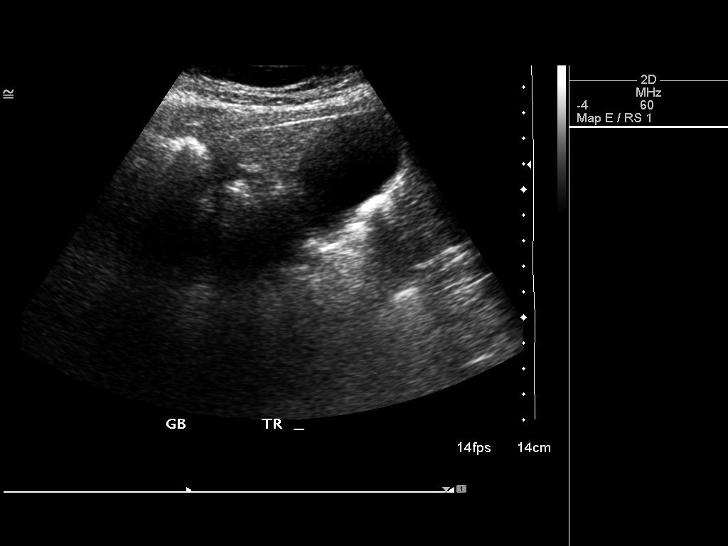
[im 45/68]
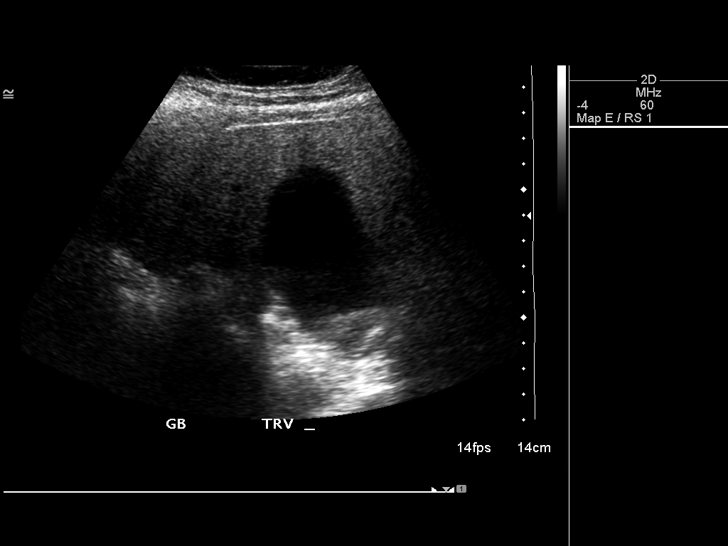
[im 51/68]
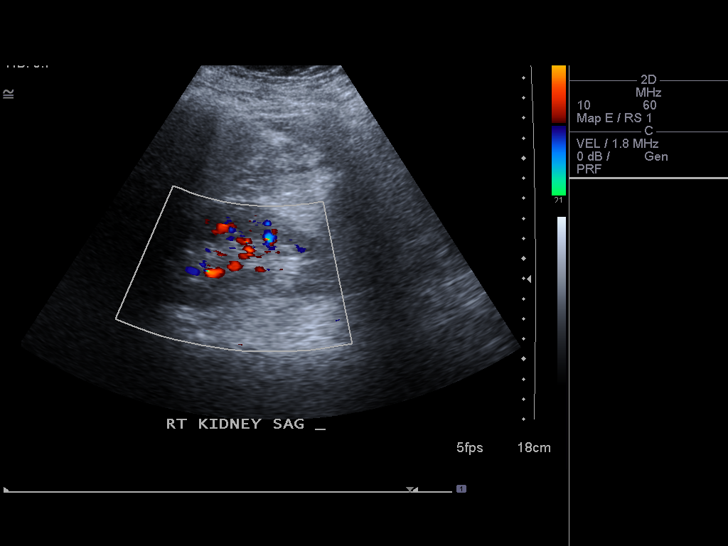
[im 56/68]
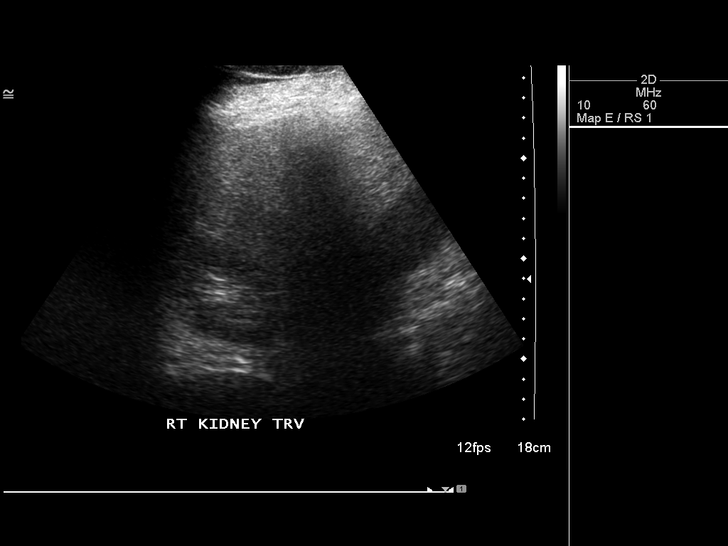
[im 62/68]
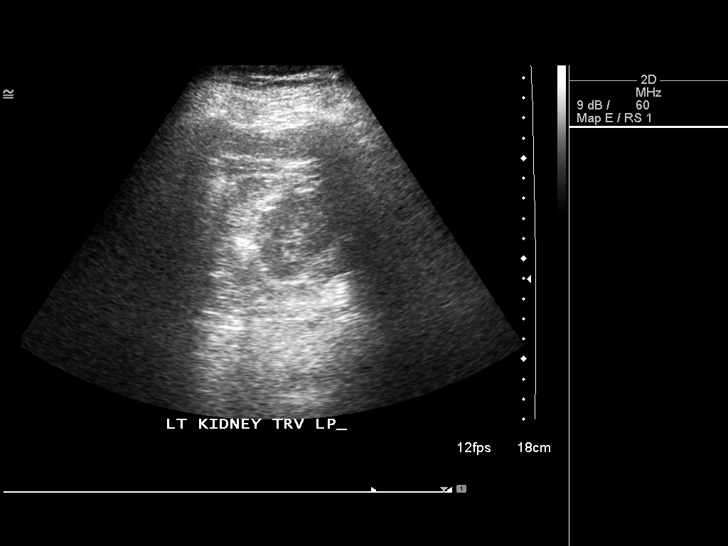
[im 68/68]
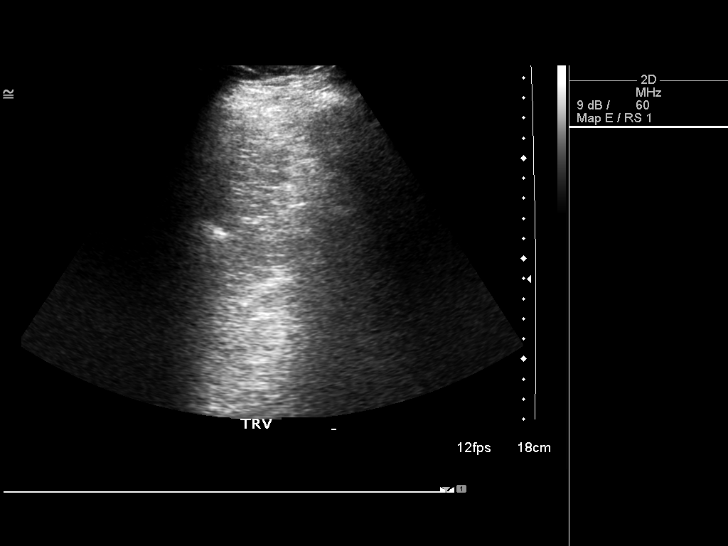

[14 of 25 positions shown; findings below may reference images not displayed]

FINDINGS: Gallbladder:

No gallstones or wall thickening visualized. No sonographic Murphy
sign noted.

Common bile duct:

Diameter: 5 mm, within normal limits for age.

Liver:

Heterogeneous echotexture with increased echotexture compared to the
adjacent right kidney. This is compatible with geographic
hepatosteatosis which was seen on prior CT. No focal mass lesion or
intrahepatic biliary ductal dilation.

IVC:

Suboptimally visualized.  No gross abnormality.

Pancreas:

Suboptimally visualized.  No gross abnormality.

Spleen:

Obscured by bowel gas.

Right Kidney:

Length: 10.5 cm. Echogenicity within normal limits. No mass or
hydronephrosis visualized.

Left Kidney:

Length: 11.4 cm. Echogenicity within normal limits. No mass or
hydronephrosis visualized.

Abdominal aorta:

No aneurysm visualized.

Other findings:

None.
IMPRESSION: 1. Normal appearance of the gallbladder. Negative for cholelithiasis
or cholecystitis.
2. Echogenic liver compatible with hepatosteatosis.  No mass lesion.

## 2015-08-12 ENCOUNTER — Telehealth: Payer: Self-pay | Admitting: Cardiology

## 2015-08-12 NOTE — Telephone Encounter (Signed)
Received records from [facility] for appointment on 08/20/15 with Dr Jens Som Kathryne Sharper.  Records given to [facility] (medical records) for Dr Ludwig Clarks schedule on 08/20/15. lp

## 2015-08-14 NOTE — Progress Notes (Signed)
Referring: Lovenia Kim [facility]  HPI: 45 yo male for evaluation of chest pain. Laboratories January 2017 showed hemoglobin 14.8, creatinine 1.07, SGOT 47 and SGPT 62. Troponin normal. Patient states he has had intermittent chest pain since age 5. It is in the  Substernal area and left chest area. No radiation. Occurs both at rest and with exertion. No nausea or diaphoresis but occasional dyspnea. Not pleuritic, positional, related to food or clearly exertional. No radiation. Last 30 minutes and resolves. Also notes dyspnea on exertion. Also complains of occasional pedal edema. Because of the above cardiology was asked to evaluate.  Current Outpatient Prescriptions  Medication Sig Dispense Refill  . albuterol (PROVENTIL HFA;VENTOLIN HFA) 108 (90 BASE) MCG/ACT inhaler Inhale 2 puffs into the lungs every 6 (six) hours as needed. For shortness of breath    . ALPRAZolam (XANAX) 0.5 MG tablet Take 0.5 mg by mouth as needed.     Marland Kitchen HYDROcodone-acetaminophen (NORCO) 10-325 MG tablet Take 1 tablet by mouth every 6 (six) hours as needed for moderate pain.     Marland Kitchen loratadine (CLARITIN) 10 MG tablet Take 1 tablet (10 mg total) by mouth daily. 30 tablet 0  . losartan (COZAAR) 100 MG tablet Take 100 mg by mouth daily.      No current facility-administered medications for this visit.    Allergies  Allergen Reactions  . Ciprofloxacin Swelling and Shortness Of Breath  . Penicillins Rash  . Sulfa Antibiotics Rash  . Gluten Meal Nausea And Vomiting  . Glutethimides     Severe as stated by wife  . Montelukast Sodium   . Montelukast Rash  . Sulfamethoxazole Rash     Past Medical History  Diagnosis Date  . Celiac disease   . Mitral valve prolapse   . Asthma     mild during peak allery season  . GERD (gastroesophageal reflux disease)   . Hypertension   . Hyperlipidemia     Past Surgical History  Procedure Laterality Date  . Knee arthroscopy      x2 R  . Back surgery  04/2007  . Tonsillectomy    .  Knee arthroplasty  02/28/2012    Procedure: COMPUTER ASSISTED TOTAL KNEE ARTHROPLASTY;  Surgeon: Harvie Junior, MD;  Location: [facility];  Service: Orthopedics;  Laterality: Right;  right total knee arthroplasty    Social History   Social History  . Marital Status: Married    Spouse Name: N/A  . Number of Children: 3  . Years of Education: N/A   Occupational History  . Not on file.   Social History Main Topics  . Smoking status: Former Games developer  . Smokeless tobacco: Never Used  . Alcohol Use: No     Comment: Pt abuses prescribed meds  . Drug Use: Yes    Special: Oxycodone  . Sexual Activity: Yes   Other Topics Concern  . Not on file   Social History Narrative    Family History  Problem Relation Age of Onset  . Heart disease Mother     MI    ROS: complains of fatigue but no fevers or chills, productive cough, hemoptysis, dysphasia, odynophagia, melena, hematochezia, dysuria, hematuria, rash, seizure activity, orthopnea, PND, claudication. Remaining systems are negative.  Physical Exam:   Blood pressure 143/90, pulse 88, height  (1.753 m), weight 227 lb 12.8 oz (103.329 kg), SpO2 96 %.  General:  Well developed/well nourished in NAD Skin warm/dry Patient not depressed No peripheral clubbing Back-normal HEENT-normal/normal eyelids Neck  supple/normal carotid upstroke bilaterally; no bruits; no JVD; no thyromegaly chest - CTA/ normal expansion CV - RRR/normal S1 and S2; no murmurs, rubs or gallops;  PMI nondisplaced Abdomen -NT/ND, no HSM, no mass, + bowel sounds, no bruit 2+ femoral pulses, no bruits Ext-no edema, chords, 2+ DP Neuro-grossly nonfocal  ECG 07/22/15-Sinus rhythm at a rate of 86. No ST changes.  Today's electrocardiogram shows sinus rhythm with no ST changes.

## 2015-08-20 ENCOUNTER — Encounter: Payer: Self-pay | Admitting: Cardiology

## 2015-08-20 ENCOUNTER — Ambulatory Visit (INDEPENDENT_AMBULATORY_CARE_PROVIDER_SITE_OTHER): Payer: BLUE CROSS/BLUE SHIELD | Admitting: Cardiology

## 2015-08-20 VITALS — BP 143/90 | HR 88 | Ht 69.0 in | Wt 227.8 lb

## 2015-08-20 DIAGNOSIS — R079 Chest pain, unspecified: Secondary | ICD-10-CM

## 2015-08-20 DIAGNOSIS — I341 Nonrheumatic mitral (valve) prolapse: Secondary | ICD-10-CM

## 2015-08-20 DIAGNOSIS — R072 Precordial pain: Secondary | ICD-10-CM

## 2015-08-20 DIAGNOSIS — I10 Essential (primary) hypertension: Secondary | ICD-10-CM

## 2015-08-20 NOTE — Assessment & Plan Note (Signed)
Blood pressure borderline. We will follow and advance medications as needed.

## 2015-08-20 NOTE — Assessment & Plan Note (Addendum)
Patient describes some pedal edema at times although none on examination. Also with dyspnea. We will arrange an echocardiogram to assess LV function and to screen for mitral valve prolapse which he states was diagnosed previously.

## 2015-08-20 NOTE — Assessment & Plan Note (Signed)
Symptoms atypical. Electrocardiogram shows no ST changes. Plan exercise treadmill for risk stratification.

## 2015-08-20 NOTE — Patient Instructions (Signed)
Medication Instructions:   NO CHANGE  Testing/Procedures:  Your physician has requested that you have an exercise tolerance test. For further information please visit [email or web address]. Please also follow instruction sheet, as given.   Your physician has requested that you have an echocardiogram. Echocardiography is a painless test that uses sound waves to create images of your heart. It provides your doctor with information about the size and shape of your heart and how well your heart's chambers and valves are working. This procedure takes approximately one hour. There are no restrictions for this procedure.    Follow-Up:  Your physician recommends that you schedule a follow-up appointment in: AS NEEDED PENDING TEST RESULTS    Exercise Stress Electrocardiogram An exercise stress electrocardiogram is a test to check how blood flows to your heart. It is done to find areas of poor blood flow. You will need to walk on a treadmill for this test. The electrocardiogram will record your heartbeat when you are at rest and when you are exercising. BEFORE THE PROCEDURE  Do not have drinks with caffeine or foods with caffeine for 24 hours before the test, or as told by your doctor. This includes coffee, tea (even decaf tea), sodas, chocolate, and cocoa.  Follow your doctor's instructions about eating and drinking before the test.  Ask your doctor what medicines you should or should not take before the test. Take your medicines with water unless told by your doctor not to.  If you use an inhaler, bring it with you to the test.  Bring a snack to eat after the test.  Do not  smoke for 4 hours before the test.  Do not put lotions, powders, creams, or oils on your chest before the test.  Wear comfortable shoes and clothing. PROCEDURE  You will have patches put on your chest. Small areas of your chest may need to be shaved. Wires will be connected to the patches.  Your heart rate will be  watched while you are resting and while you are exercising.  You will walk on the treadmill. The treadmill will slowly get faster to raise your heart rate.  The test will take about 1-2 hours. AFTER THE PROCEDURE  Your heart rate and blood pressure will be watched after the test.  You may return to your normal diet, activities, and medicines or as told by your doctor.   This information is not intended to replace advice given to you by your health care provider. Make sure you discuss any questions you have with your health care provider.   Document Released: 12/08/2007 Document Revised: 07/12/2014 Document Reviewed: 02/26/2013 Elsevier Interactive Patient Education 2016 Elsevier Inc.  

## 2015-09-02 ENCOUNTER — Ambulatory Visit (HOSPITAL_COMMUNITY): Payer: BLUE CROSS/BLUE SHIELD | Attending: Cardiovascular Disease

## 2015-09-02 ENCOUNTER — Other Ambulatory Visit: Payer: Self-pay

## 2015-09-02 ENCOUNTER — Ambulatory Visit (INDEPENDENT_AMBULATORY_CARE_PROVIDER_SITE_OTHER): Payer: BLUE CROSS/BLUE SHIELD

## 2015-09-02 DIAGNOSIS — E785 Hyperlipidemia, unspecified: Secondary | ICD-10-CM | POA: Diagnosis not present

## 2015-09-02 DIAGNOSIS — I10 Essential (primary) hypertension: Secondary | ICD-10-CM | POA: Insufficient documentation

## 2015-09-02 DIAGNOSIS — R079 Chest pain, unspecified: Secondary | ICD-10-CM

## 2015-09-02 DIAGNOSIS — I059 Rheumatic mitral valve disease, unspecified: Secondary | ICD-10-CM | POA: Diagnosis present

## 2015-09-02 DIAGNOSIS — I340 Nonrheumatic mitral (valve) insufficiency: Secondary | ICD-10-CM | POA: Diagnosis not present

## 2015-09-02 DIAGNOSIS — I341 Nonrheumatic mitral (valve) prolapse: Secondary | ICD-10-CM | POA: Insufficient documentation

## 2015-09-02 LAB — EXERCISE TOLERANCE TEST
Estimated workload: 9.9 METS
Exercise duration (min): 9 min
Exercise duration (sec): 0 s
MPHR: 173 {beats}/min
Peak BP: 200 mmHg
Peak HR: 153 {beats}/min
Percent HR: 88 %
Percent of predicted max HR: 88 %
RPE: 17
Rest HR: 73 {beats}/min
Stage 1 DBP: 92 mmHg
Stage 1 Grade: 0 %
Stage 1 HR: 83 {beats}/min
Stage 1 SBP: 139 mmHg
Stage 1 Speed: 0 mph
Stage 2 Grade: 0 %
Stage 2 HR: 80 {beats}/min
Stage 2 Speed: 0.8 mph
Stage 3 Grade: 0 %
Stage 3 HR: 78 {beats}/min
Stage 3 Speed: 1 mph
Stage 4 DBP: 86 mmHg
Stage 4 Grade: 10 %
Stage 4 HR: 106 {beats}/min
Stage 4 SBP: 135 mmHg
Stage 4 Speed: 1.7 mph
Stage 5 DBP: 89 mmHg
Stage 5 Grade: 12 %
Stage 5 HR: 115 {beats}/min
Stage 5 SBP: 151 mmHg
Stage 5 Speed: 2.5 mph
Stage 6 DBP: 91 mmHg
Stage 6 Grade: 14 %
Stage 6 HR: 153 {beats}/min
Stage 6 SBP: 200 mmHg
Stage 6 Speed: 3.3 mph
Stage 7 DBP: 87 mmHg
Stage 7 Grade: 0 %
Stage 7 HR: 122 {beats}/min
Stage 7 SBP: 168 mmHg
Stage 7 Speed: 0 mph
Stage 8 DBP: 93 mmHg
Stage 8 Grade: 0 %
Stage 8 HR: 86 {beats}/min
Stage 8 SBP: 120 mmHg
Stage 8 Speed: 0 mph

## 2015-10-09 DIAGNOSIS — F411 Generalized anxiety disorder: Secondary | ICD-10-CM | POA: Diagnosis not present

## 2015-10-10 DIAGNOSIS — R1110 Vomiting, unspecified: Secondary | ICD-10-CM | POA: Diagnosis not present

## 2015-10-10 DIAGNOSIS — R112 Nausea with vomiting, unspecified: Secondary | ICD-10-CM | POA: Diagnosis not present

## 2015-10-10 DIAGNOSIS — R110 Nausea: Secondary | ICD-10-CM | POA: Diagnosis not present

## 2015-10-16 DIAGNOSIS — J3089 Other allergic rhinitis: Secondary | ICD-10-CM | POA: Diagnosis not present

## 2015-10-16 DIAGNOSIS — R05 Cough: Secondary | ICD-10-CM | POA: Diagnosis not present

## 2015-10-16 DIAGNOSIS — J301 Allergic rhinitis due to pollen: Secondary | ICD-10-CM | POA: Diagnosis not present

## 2015-10-16 DIAGNOSIS — J4520 Mild intermittent asthma, uncomplicated: Secondary | ICD-10-CM | POA: Diagnosis not present

## 2015-10-16 DIAGNOSIS — R109 Unspecified abdominal pain: Secondary | ICD-10-CM | POA: Diagnosis not present

## 2015-10-27 DIAGNOSIS — K900 Celiac disease: Secondary | ICD-10-CM | POA: Diagnosis not present

## 2015-10-29 DIAGNOSIS — R5383 Other fatigue: Secondary | ICD-10-CM | POA: Diagnosis not present

## 2015-10-29 DIAGNOSIS — K529 Noninfective gastroenteritis and colitis, unspecified: Secondary | ICD-10-CM | POA: Diagnosis not present

## 2015-10-29 DIAGNOSIS — K900 Celiac disease: Secondary | ICD-10-CM | POA: Diagnosis not present

## 2015-10-29 DIAGNOSIS — R1084 Generalized abdominal pain: Secondary | ICD-10-CM | POA: Diagnosis not present

## 2015-10-30 DIAGNOSIS — M4697 Unspecified inflammatory spondylopathy, lumbosacral region: Secondary | ICD-10-CM | POA: Diagnosis not present

## 2015-10-30 DIAGNOSIS — M961 Postlaminectomy syndrome, not elsewhere classified: Secondary | ICD-10-CM | POA: Diagnosis not present

## 2015-10-30 DIAGNOSIS — Z79899 Other long term (current) drug therapy: Secondary | ICD-10-CM | POA: Diagnosis not present

## 2015-10-30 DIAGNOSIS — Z79891 Long term (current) use of opiate analgesic: Secondary | ICD-10-CM | POA: Diagnosis not present

## 2015-10-30 DIAGNOSIS — G894 Chronic pain syndrome: Secondary | ICD-10-CM | POA: Diagnosis not present

## 2015-10-30 DIAGNOSIS — M5417 Radiculopathy, lumbosacral region: Secondary | ICD-10-CM | POA: Diagnosis not present

## 2015-10-30 DIAGNOSIS — M545 Low back pain: Secondary | ICD-10-CM | POA: Diagnosis not present

## 2015-11-18 DIAGNOSIS — K900 Celiac disease: Secondary | ICD-10-CM | POA: Diagnosis not present

## 2015-11-27 DIAGNOSIS — G894 Chronic pain syndrome: Secondary | ICD-10-CM | POA: Diagnosis not present

## 2015-11-27 DIAGNOSIS — M545 Low back pain: Secondary | ICD-10-CM | POA: Diagnosis not present

## 2015-11-27 DIAGNOSIS — Z79899 Other long term (current) drug therapy: Secondary | ICD-10-CM | POA: Diagnosis not present

## 2015-11-27 DIAGNOSIS — M4697 Unspecified inflammatory spondylopathy, lumbosacral region: Secondary | ICD-10-CM | POA: Diagnosis not present

## 2015-11-27 DIAGNOSIS — M5417 Radiculopathy, lumbosacral region: Secondary | ICD-10-CM | POA: Diagnosis not present

## 2015-11-27 DIAGNOSIS — M961 Postlaminectomy syndrome, not elsewhere classified: Secondary | ICD-10-CM | POA: Diagnosis not present

## 2015-11-27 DIAGNOSIS — Z79891 Long term (current) use of opiate analgesic: Secondary | ICD-10-CM | POA: Diagnosis not present

## 2015-12-19 DIAGNOSIS — K900 Celiac disease: Secondary | ICD-10-CM | POA: Diagnosis not present

## 2015-12-29 DIAGNOSIS — M5417 Radiculopathy, lumbosacral region: Secondary | ICD-10-CM | POA: Diagnosis not present

## 2015-12-29 DIAGNOSIS — M4697 Unspecified inflammatory spondylopathy, lumbosacral region: Secondary | ICD-10-CM | POA: Diagnosis not present

## 2015-12-29 DIAGNOSIS — Z79899 Other long term (current) drug therapy: Secondary | ICD-10-CM | POA: Diagnosis not present

## 2015-12-29 DIAGNOSIS — Z79891 Long term (current) use of opiate analgesic: Secondary | ICD-10-CM | POA: Diagnosis not present

## 2015-12-29 DIAGNOSIS — M961 Postlaminectomy syndrome, not elsewhere classified: Secondary | ICD-10-CM | POA: Diagnosis not present

## 2015-12-29 DIAGNOSIS — G894 Chronic pain syndrome: Secondary | ICD-10-CM | POA: Diagnosis not present

## 2016-01-15 DIAGNOSIS — Z79899 Other long term (current) drug therapy: Secondary | ICD-10-CM | POA: Diagnosis not present

## 2016-01-15 DIAGNOSIS — M961 Postlaminectomy syndrome, not elsewhere classified: Secondary | ICD-10-CM | POA: Diagnosis not present

## 2016-01-15 DIAGNOSIS — G894 Chronic pain syndrome: Secondary | ICD-10-CM | POA: Diagnosis not present

## 2016-01-15 DIAGNOSIS — M4697 Unspecified inflammatory spondylopathy, lumbosacral region: Secondary | ICD-10-CM | POA: Diagnosis not present

## 2016-01-15 DIAGNOSIS — Z79891 Long term (current) use of opiate analgesic: Secondary | ICD-10-CM | POA: Diagnosis not present

## 2016-01-27 DIAGNOSIS — M5417 Radiculopathy, lumbosacral region: Secondary | ICD-10-CM | POA: Diagnosis not present

## 2016-01-27 DIAGNOSIS — Z79899 Other long term (current) drug therapy: Secondary | ICD-10-CM | POA: Diagnosis not present

## 2016-01-27 DIAGNOSIS — Z79891 Long term (current) use of opiate analgesic: Secondary | ICD-10-CM | POA: Diagnosis not present

## 2016-01-27 DIAGNOSIS — M4697 Unspecified inflammatory spondylopathy, lumbosacral region: Secondary | ICD-10-CM | POA: Diagnosis not present

## 2016-01-27 DIAGNOSIS — G894 Chronic pain syndrome: Secondary | ICD-10-CM | POA: Diagnosis not present

## 2016-01-27 DIAGNOSIS — M961 Postlaminectomy syndrome, not elsewhere classified: Secondary | ICD-10-CM | POA: Diagnosis not present

## 2016-02-03 DIAGNOSIS — K900 Celiac disease: Secondary | ICD-10-CM | POA: Diagnosis not present

## 2016-02-03 DIAGNOSIS — R197 Diarrhea, unspecified: Secondary | ICD-10-CM | POA: Diagnosis not present

## 2016-02-03 DIAGNOSIS — R112 Nausea with vomiting, unspecified: Secondary | ICD-10-CM | POA: Diagnosis not present

## 2016-02-03 DIAGNOSIS — K219 Gastro-esophageal reflux disease without esophagitis: Secondary | ICD-10-CM | POA: Diagnosis not present

## 2016-02-15 DIAGNOSIS — Z23 Encounter for immunization: Secondary | ICD-10-CM | POA: Diagnosis not present

## 2016-02-24 DIAGNOSIS — G894 Chronic pain syndrome: Secondary | ICD-10-CM | POA: Diagnosis not present

## 2016-02-24 DIAGNOSIS — Z79891 Long term (current) use of opiate analgesic: Secondary | ICD-10-CM | POA: Diagnosis not present

## 2016-02-24 DIAGNOSIS — M5417 Radiculopathy, lumbosacral region: Secondary | ICD-10-CM | POA: Diagnosis not present

## 2016-02-24 DIAGNOSIS — Z79899 Other long term (current) drug therapy: Secondary | ICD-10-CM | POA: Diagnosis not present

## 2016-02-24 DIAGNOSIS — M4697 Unspecified inflammatory spondylopathy, lumbosacral region: Secondary | ICD-10-CM | POA: Diagnosis not present

## 2016-02-24 DIAGNOSIS — M961 Postlaminectomy syndrome, not elsewhere classified: Secondary | ICD-10-CM | POA: Diagnosis not present

## 2016-03-23 DIAGNOSIS — M545 Low back pain: Secondary | ICD-10-CM | POA: Diagnosis not present

## 2016-03-23 DIAGNOSIS — Z79899 Other long term (current) drug therapy: Secondary | ICD-10-CM | POA: Diagnosis not present

## 2016-03-23 DIAGNOSIS — Z79891 Long term (current) use of opiate analgesic: Secondary | ICD-10-CM | POA: Diagnosis not present

## 2016-03-23 DIAGNOSIS — M4697 Unspecified inflammatory spondylopathy, lumbosacral region: Secondary | ICD-10-CM | POA: Diagnosis not present

## 2016-03-23 DIAGNOSIS — M5417 Radiculopathy, lumbosacral region: Secondary | ICD-10-CM | POA: Diagnosis not present

## 2016-03-23 DIAGNOSIS — M961 Postlaminectomy syndrome, not elsewhere classified: Secondary | ICD-10-CM | POA: Diagnosis not present

## 2016-03-23 DIAGNOSIS — G894 Chronic pain syndrome: Secondary | ICD-10-CM | POA: Diagnosis not present

## 2016-04-20 ENCOUNTER — Other Ambulatory Visit: Payer: Self-pay | Admitting: Pain Medicine

## 2016-04-20 DIAGNOSIS — M25559 Pain in unspecified hip: Secondary | ICD-10-CM | POA: Diagnosis not present

## 2016-04-20 DIAGNOSIS — Z79891 Long term (current) use of opiate analgesic: Secondary | ICD-10-CM | POA: Diagnosis not present

## 2016-04-20 DIAGNOSIS — M545 Low back pain, unspecified: Secondary | ICD-10-CM

## 2016-04-20 DIAGNOSIS — G894 Chronic pain syndrome: Secondary | ICD-10-CM | POA: Diagnosis not present

## 2016-04-20 DIAGNOSIS — M4697 Unspecified inflammatory spondylopathy, lumbosacral region: Secondary | ICD-10-CM | POA: Diagnosis not present

## 2016-04-20 DIAGNOSIS — M5417 Radiculopathy, lumbosacral region: Secondary | ICD-10-CM | POA: Diagnosis not present

## 2016-04-20 DIAGNOSIS — Z79899 Other long term (current) drug therapy: Secondary | ICD-10-CM | POA: Diagnosis not present

## 2016-04-28 DIAGNOSIS — M7060 Trochanteric bursitis, unspecified hip: Secondary | ICD-10-CM | POA: Diagnosis not present

## 2016-05-10 ENCOUNTER — Inpatient Hospital Stay: Admission: RE | Admit: 2016-05-10 | Payer: Self-pay | Source: Ambulatory Visit

## 2016-05-10 DIAGNOSIS — M940 Chondrocostal junction syndrome [Tietze]: Secondary | ICD-10-CM | POA: Diagnosis not present

## 2016-05-10 DIAGNOSIS — R079 Chest pain, unspecified: Secondary | ICD-10-CM | POA: Diagnosis not present

## 2016-05-18 DIAGNOSIS — M961 Postlaminectomy syndrome, not elsewhere classified: Secondary | ICD-10-CM | POA: Diagnosis not present

## 2016-05-18 DIAGNOSIS — M25559 Pain in unspecified hip: Secondary | ICD-10-CM | POA: Diagnosis not present

## 2016-05-18 DIAGNOSIS — G894 Chronic pain syndrome: Secondary | ICD-10-CM | POA: Diagnosis not present

## 2016-05-18 DIAGNOSIS — Z79891 Long term (current) use of opiate analgesic: Secondary | ICD-10-CM | POA: Diagnosis not present

## 2016-05-18 DIAGNOSIS — M5417 Radiculopathy, lumbosacral region: Secondary | ICD-10-CM | POA: Diagnosis not present

## 2016-05-18 DIAGNOSIS — M4697 Unspecified inflammatory spondylopathy, lumbosacral region: Secondary | ICD-10-CM | POA: Diagnosis not present

## 2016-05-18 DIAGNOSIS — Z79899 Other long term (current) drug therapy: Secondary | ICD-10-CM | POA: Diagnosis not present

## 2016-05-18 DIAGNOSIS — M545 Low back pain: Secondary | ICD-10-CM | POA: Diagnosis not present

## 2016-05-19 ENCOUNTER — Ambulatory Visit
Admission: RE | Admit: 2016-05-19 | Discharge: 2016-05-19 | Disposition: A | Discharge status: Home / Self Care | Payer: BLUE CROSS/BLUE SHIELD | Source: Ambulatory Visit | Attending: Pain Medicine | Admitting: Pain Medicine

## 2016-05-19 DIAGNOSIS — M545 Low back pain, unspecified: Secondary | ICD-10-CM

## 2016-05-19 DIAGNOSIS — M5126 Other intervertebral disc displacement, lumbar region: Secondary | ICD-10-CM | POA: Diagnosis not present

## 2016-05-29 DIAGNOSIS — H66003 Acute suppurative otitis media without spontaneous rupture of ear drum, bilateral: Secondary | ICD-10-CM | POA: Diagnosis not present

## 2016-05-29 DIAGNOSIS — J209 Acute bronchitis, unspecified: Secondary | ICD-10-CM | POA: Diagnosis not present

## 2016-06-06 DIAGNOSIS — R05 Cough: Secondary | ICD-10-CM | POA: Diagnosis not present

## 2016-06-11 DIAGNOSIS — M5417 Radiculopathy, lumbosacral region: Secondary | ICD-10-CM | POA: Diagnosis not present

## 2016-06-11 DIAGNOSIS — Z79899 Other long term (current) drug therapy: Secondary | ICD-10-CM | POA: Diagnosis not present

## 2016-06-11 DIAGNOSIS — M4697 Unspecified inflammatory spondylopathy, lumbosacral region: Secondary | ICD-10-CM | POA: Diagnosis not present

## 2016-06-11 DIAGNOSIS — M961 Postlaminectomy syndrome, not elsewhere classified: Secondary | ICD-10-CM | POA: Diagnosis not present

## 2016-06-11 DIAGNOSIS — G894 Chronic pain syndrome: Secondary | ICD-10-CM | POA: Diagnosis not present

## 2016-06-11 DIAGNOSIS — Z79891 Long term (current) use of opiate analgesic: Secondary | ICD-10-CM | POA: Diagnosis not present

## 2016-07-09 DIAGNOSIS — Z79891 Long term (current) use of opiate analgesic: Secondary | ICD-10-CM | POA: Diagnosis not present

## 2016-07-09 DIAGNOSIS — M4697 Unspecified inflammatory spondylopathy, lumbosacral region: Secondary | ICD-10-CM | POA: Diagnosis not present

## 2016-07-09 DIAGNOSIS — M25559 Pain in unspecified hip: Secondary | ICD-10-CM | POA: Diagnosis not present

## 2016-07-09 DIAGNOSIS — G894 Chronic pain syndrome: Secondary | ICD-10-CM | POA: Diagnosis not present

## 2016-07-09 DIAGNOSIS — Z79899 Other long term (current) drug therapy: Secondary | ICD-10-CM | POA: Diagnosis not present

## 2016-07-09 DIAGNOSIS — M5417 Radiculopathy, lumbosacral region: Secondary | ICD-10-CM | POA: Diagnosis not present

## 2016-07-20 DIAGNOSIS — M545 Low back pain: Secondary | ICD-10-CM | POA: Diagnosis not present

## 2016-07-20 DIAGNOSIS — M4697 Unspecified inflammatory spondylopathy, lumbosacral region: Secondary | ICD-10-CM | POA: Diagnosis not present

## 2016-07-20 DIAGNOSIS — M5417 Radiculopathy, lumbosacral region: Secondary | ICD-10-CM | POA: Diagnosis not present

## 2016-07-20 DIAGNOSIS — M961 Postlaminectomy syndrome, not elsewhere classified: Secondary | ICD-10-CM | POA: Diagnosis not present

## 2016-08-06 DIAGNOSIS — M25559 Pain in unspecified hip: Secondary | ICD-10-CM | POA: Diagnosis not present

## 2016-08-06 DIAGNOSIS — M4697 Unspecified inflammatory spondylopathy, lumbosacral region: Secondary | ICD-10-CM | POA: Diagnosis not present

## 2016-08-06 DIAGNOSIS — M961 Postlaminectomy syndrome, not elsewhere classified: Secondary | ICD-10-CM | POA: Diagnosis not present

## 2016-08-06 DIAGNOSIS — Z79899 Other long term (current) drug therapy: Secondary | ICD-10-CM | POA: Diagnosis not present

## 2016-08-06 DIAGNOSIS — Z79891 Long term (current) use of opiate analgesic: Secondary | ICD-10-CM | POA: Diagnosis not present

## 2016-08-06 DIAGNOSIS — G894 Chronic pain syndrome: Secondary | ICD-10-CM | POA: Diagnosis not present

## 2016-08-10 DIAGNOSIS — K529 Noninfective gastroenteritis and colitis, unspecified: Secondary | ICD-10-CM | POA: Diagnosis not present

## 2017-04-04 DIAGNOSIS — Z79899 Other long term (current) drug therapy: Secondary | ICD-10-CM | POA: Diagnosis not present

## 2017-04-04 DIAGNOSIS — G894 Chronic pain syndrome: Secondary | ICD-10-CM | POA: Diagnosis not present

## 2017-04-04 DIAGNOSIS — M25569 Pain in unspecified knee: Secondary | ICD-10-CM | POA: Diagnosis not present

## 2017-04-04 DIAGNOSIS — M5417 Radiculopathy, lumbosacral region: Secondary | ICD-10-CM | POA: Diagnosis not present

## 2017-04-04 DIAGNOSIS — M7060 Trochanteric bursitis, unspecified hip: Secondary | ICD-10-CM | POA: Diagnosis not present

## 2017-04-04 DIAGNOSIS — Z79891 Long term (current) use of opiate analgesic: Secondary | ICD-10-CM | POA: Diagnosis not present

## 2017-05-10 DIAGNOSIS — M545 Low back pain: Secondary | ICD-10-CM | POA: Diagnosis not present

## 2017-05-10 DIAGNOSIS — M5417 Radiculopathy, lumbosacral region: Secondary | ICD-10-CM | POA: Diagnosis not present

## 2017-05-10 DIAGNOSIS — G894 Chronic pain syndrome: Secondary | ICD-10-CM | POA: Diagnosis not present

## 2017-05-10 DIAGNOSIS — M961 Postlaminectomy syndrome, not elsewhere classified: Secondary | ICD-10-CM | POA: Diagnosis not present

## 2017-06-30 DIAGNOSIS — Z79899 Other long term (current) drug therapy: Secondary | ICD-10-CM | POA: Diagnosis not present

## 2017-06-30 DIAGNOSIS — M961 Postlaminectomy syndrome, not elsewhere classified: Secondary | ICD-10-CM | POA: Diagnosis not present

## 2017-06-30 DIAGNOSIS — Z79891 Long term (current) use of opiate analgesic: Secondary | ICD-10-CM | POA: Diagnosis not present

## 2017-06-30 DIAGNOSIS — M79606 Pain in leg, unspecified: Secondary | ICD-10-CM | POA: Diagnosis not present

## 2017-06-30 DIAGNOSIS — G894 Chronic pain syndrome: Secondary | ICD-10-CM | POA: Diagnosis not present

## 2017-06-30 DIAGNOSIS — M5417 Radiculopathy, lumbosacral region: Secondary | ICD-10-CM | POA: Diagnosis not present

## 2017-07-14 DIAGNOSIS — F0634 Mood disorder due to known physiological condition with mixed features: Secondary | ICD-10-CM | POA: Diagnosis not present

## 2017-07-27 DIAGNOSIS — M5417 Radiculopathy, lumbosacral region: Secondary | ICD-10-CM | POA: Diagnosis not present

## 2017-07-27 DIAGNOSIS — M79606 Pain in leg, unspecified: Secondary | ICD-10-CM | POA: Diagnosis not present

## 2017-07-27 DIAGNOSIS — G894 Chronic pain syndrome: Secondary | ICD-10-CM | POA: Diagnosis not present

## 2017-07-27 DIAGNOSIS — M961 Postlaminectomy syndrome, not elsewhere classified: Secondary | ICD-10-CM | POA: Diagnosis not present

## 2017-07-28 DIAGNOSIS — J069 Acute upper respiratory infection, unspecified: Secondary | ICD-10-CM | POA: Diagnosis not present

## 2017-08-24 DIAGNOSIS — G894 Chronic pain syndrome: Secondary | ICD-10-CM | POA: Diagnosis not present

## 2017-08-24 DIAGNOSIS — M5417 Radiculopathy, lumbosacral region: Secondary | ICD-10-CM | POA: Diagnosis not present

## 2017-08-24 DIAGNOSIS — M545 Low back pain: Secondary | ICD-10-CM | POA: Diagnosis not present

## 2017-08-24 DIAGNOSIS — M961 Postlaminectomy syndrome, not elsewhere classified: Secondary | ICD-10-CM | POA: Diagnosis not present

## 2017-09-27 DIAGNOSIS — Z880 Allergy status to penicillin: Secondary | ICD-10-CM | POA: Diagnosis not present

## 2017-09-27 DIAGNOSIS — M545 Low back pain: Secondary | ICD-10-CM | POA: Diagnosis not present

## 2017-09-27 DIAGNOSIS — Z87891 Personal history of nicotine dependence: Secondary | ICD-10-CM | POA: Diagnosis not present

## 2017-09-27 DIAGNOSIS — Z881 Allergy status to other antibiotic agents status: Secondary | ICD-10-CM | POA: Diagnosis not present

## 2017-09-27 DIAGNOSIS — Z882 Allergy status to sulfonamides status: Secondary | ICD-10-CM | POA: Diagnosis not present

## 2017-09-27 DIAGNOSIS — M961 Postlaminectomy syndrome, not elsewhere classified: Secondary | ICD-10-CM | POA: Diagnosis not present

## 2017-09-27 DIAGNOSIS — G894 Chronic pain syndrome: Secondary | ICD-10-CM | POA: Diagnosis not present

## 2017-09-27 DIAGNOSIS — Z6832 Body mass index (BMI) 32.0-32.9, adult: Secondary | ICD-10-CM | POA: Diagnosis not present

## 2017-09-27 DIAGNOSIS — J45909 Unspecified asthma, uncomplicated: Secondary | ICD-10-CM | POA: Diagnosis not present

## 2017-09-27 DIAGNOSIS — E669 Obesity, unspecified: Secondary | ICD-10-CM | POA: Diagnosis not present

## 2017-09-27 DIAGNOSIS — M5417 Radiculopathy, lumbosacral region: Secondary | ICD-10-CM | POA: Diagnosis not present

## 2017-10-04 DIAGNOSIS — M5417 Radiculopathy, lumbosacral region: Secondary | ICD-10-CM | POA: Diagnosis not present

## 2017-10-04 DIAGNOSIS — M961 Postlaminectomy syndrome, not elsewhere classified: Secondary | ICD-10-CM | POA: Diagnosis not present

## 2017-10-04 DIAGNOSIS — M545 Low back pain: Secondary | ICD-10-CM | POA: Diagnosis not present

## 2017-10-04 DIAGNOSIS — G894 Chronic pain syndrome: Secondary | ICD-10-CM | POA: Diagnosis not present

## 2017-10-27 DIAGNOSIS — M5417 Radiculopathy, lumbosacral region: Secondary | ICD-10-CM | POA: Diagnosis not present

## 2017-10-27 DIAGNOSIS — M961 Postlaminectomy syndrome, not elsewhere classified: Secondary | ICD-10-CM | POA: Diagnosis not present

## 2017-10-27 DIAGNOSIS — Z9889 Other specified postprocedural states: Secondary | ICD-10-CM | POA: Diagnosis not present

## 2017-10-27 DIAGNOSIS — G894 Chronic pain syndrome: Secondary | ICD-10-CM | POA: Diagnosis not present

## 2017-11-21 DIAGNOSIS — M961 Postlaminectomy syndrome, not elsewhere classified: Secondary | ICD-10-CM | POA: Diagnosis not present

## 2017-11-21 DIAGNOSIS — M5417 Radiculopathy, lumbosacral region: Secondary | ICD-10-CM | POA: Diagnosis not present

## 2017-11-21 DIAGNOSIS — M79606 Pain in leg, unspecified: Secondary | ICD-10-CM | POA: Diagnosis not present

## 2017-11-21 DIAGNOSIS — G894 Chronic pain syndrome: Secondary | ICD-10-CM | POA: Diagnosis not present

## 2017-12-19 DIAGNOSIS — M79606 Pain in leg, unspecified: Secondary | ICD-10-CM | POA: Diagnosis not present

## 2017-12-19 DIAGNOSIS — G894 Chronic pain syndrome: Secondary | ICD-10-CM | POA: Diagnosis not present

## 2017-12-19 DIAGNOSIS — M961 Postlaminectomy syndrome, not elsewhere classified: Secondary | ICD-10-CM | POA: Diagnosis not present

## 2017-12-19 DIAGNOSIS — M5417 Radiculopathy, lumbosacral region: Secondary | ICD-10-CM | POA: Diagnosis not present

## 2017-12-19 DIAGNOSIS — Z79891 Long term (current) use of opiate analgesic: Secondary | ICD-10-CM | POA: Diagnosis not present

## 2017-12-19 DIAGNOSIS — Z79899 Other long term (current) drug therapy: Secondary | ICD-10-CM | POA: Diagnosis not present

## 2018-01-16 DIAGNOSIS — G894 Chronic pain syndrome: Secondary | ICD-10-CM | POA: Diagnosis not present

## 2018-01-16 DIAGNOSIS — Z9889 Other specified postprocedural states: Secondary | ICD-10-CM | POA: Diagnosis not present

## 2018-01-16 DIAGNOSIS — M545 Low back pain: Secondary | ICD-10-CM | POA: Diagnosis not present

## 2018-01-19 DIAGNOSIS — F419 Anxiety disorder, unspecified: Secondary | ICD-10-CM | POA: Diagnosis not present

## 2018-01-19 DIAGNOSIS — I10 Essential (primary) hypertension: Secondary | ICD-10-CM | POA: Diagnosis not present

## 2018-01-31 DIAGNOSIS — J069 Acute upper respiratory infection, unspecified: Secondary | ICD-10-CM | POA: Diagnosis not present

## 2018-02-13 DIAGNOSIS — Z79899 Other long term (current) drug therapy: Secondary | ICD-10-CM | POA: Diagnosis not present

## 2018-02-13 DIAGNOSIS — M5417 Radiculopathy, lumbosacral region: Secondary | ICD-10-CM | POA: Diagnosis not present

## 2018-02-13 DIAGNOSIS — M545 Low back pain: Secondary | ICD-10-CM | POA: Diagnosis not present

## 2018-02-13 DIAGNOSIS — M961 Postlaminectomy syndrome, not elsewhere classified: Secondary | ICD-10-CM | POA: Diagnosis not present

## 2018-02-13 DIAGNOSIS — Z79891 Long term (current) use of opiate analgesic: Secondary | ICD-10-CM | POA: Diagnosis not present

## 2018-02-13 DIAGNOSIS — G894 Chronic pain syndrome: Secondary | ICD-10-CM | POA: Diagnosis not present

## 2018-03-02 DIAGNOSIS — M5417 Radiculopathy, lumbosacral region: Secondary | ICD-10-CM | POA: Diagnosis not present

## 2018-03-02 DIAGNOSIS — G894 Chronic pain syndrome: Secondary | ICD-10-CM | POA: Diagnosis not present

## 2018-03-02 DIAGNOSIS — M961 Postlaminectomy syndrome, not elsewhere classified: Secondary | ICD-10-CM | POA: Diagnosis not present

## 2018-03-02 DIAGNOSIS — Z79899 Other long term (current) drug therapy: Secondary | ICD-10-CM | POA: Diagnosis not present

## 2018-03-02 DIAGNOSIS — Z79891 Long term (current) use of opiate analgesic: Secondary | ICD-10-CM | POA: Diagnosis not present

## 2018-03-02 DIAGNOSIS — M545 Low back pain: Secondary | ICD-10-CM | POA: Diagnosis not present

## 2018-07-24 DIAGNOSIS — Z79899 Other long term (current) drug therapy: Secondary | ICD-10-CM | POA: Diagnosis not present

## 2018-07-24 DIAGNOSIS — Z79891 Long term (current) use of opiate analgesic: Secondary | ICD-10-CM | POA: Diagnosis not present

## 2018-07-24 DIAGNOSIS — G894 Chronic pain syndrome: Secondary | ICD-10-CM | POA: Diagnosis not present

## 2018-07-24 DIAGNOSIS — M5417 Radiculopathy, lumbosacral region: Secondary | ICD-10-CM | POA: Diagnosis not present

## 2018-07-24 DIAGNOSIS — M961 Postlaminectomy syndrome, not elsewhere classified: Secondary | ICD-10-CM | POA: Diagnosis not present

## 2018-08-01 DIAGNOSIS — Z23 Encounter for immunization: Secondary | ICD-10-CM | POA: Diagnosis not present

## 2018-08-01 DIAGNOSIS — F411 Generalized anxiety disorder: Secondary | ICD-10-CM | POA: Diagnosis not present

## 2018-08-23 DIAGNOSIS — M961 Postlaminectomy syndrome, not elsewhere classified: Secondary | ICD-10-CM | POA: Diagnosis not present

## 2018-08-23 DIAGNOSIS — Z79891 Long term (current) use of opiate analgesic: Secondary | ICD-10-CM | POA: Diagnosis not present

## 2018-08-23 DIAGNOSIS — M25559 Pain in unspecified hip: Secondary | ICD-10-CM | POA: Diagnosis not present

## 2018-08-23 DIAGNOSIS — Z79899 Other long term (current) drug therapy: Secondary | ICD-10-CM | POA: Diagnosis not present

## 2018-08-23 DIAGNOSIS — G894 Chronic pain syndrome: Secondary | ICD-10-CM | POA: Diagnosis not present

## 2018-08-23 DIAGNOSIS — M5417 Radiculopathy, lumbosacral region: Secondary | ICD-10-CM | POA: Diagnosis not present

## 2018-09-04 DIAGNOSIS — M5417 Radiculopathy, lumbosacral region: Secondary | ICD-10-CM | POA: Diagnosis not present

## 2018-09-04 DIAGNOSIS — G894 Chronic pain syndrome: Secondary | ICD-10-CM | POA: Diagnosis not present

## 2018-09-04 DIAGNOSIS — M25559 Pain in unspecified hip: Secondary | ICD-10-CM | POA: Diagnosis not present

## 2018-09-04 DIAGNOSIS — M961 Postlaminectomy syndrome, not elsewhere classified: Secondary | ICD-10-CM | POA: Diagnosis not present

## 2018-09-11 DIAGNOSIS — F411 Generalized anxiety disorder: Secondary | ICD-10-CM | POA: Diagnosis not present

## 2018-09-20 DIAGNOSIS — G894 Chronic pain syndrome: Secondary | ICD-10-CM | POA: Diagnosis not present

## 2018-09-20 DIAGNOSIS — M961 Postlaminectomy syndrome, not elsewhere classified: Secondary | ICD-10-CM | POA: Diagnosis not present

## 2018-09-20 DIAGNOSIS — M5417 Radiculopathy, lumbosacral region: Secondary | ICD-10-CM | POA: Diagnosis not present

## 2018-09-20 DIAGNOSIS — M25559 Pain in unspecified hip: Secondary | ICD-10-CM | POA: Diagnosis not present

## 2018-10-09 DIAGNOSIS — F411 Generalized anxiety disorder: Secondary | ICD-10-CM | POA: Diagnosis not present

## 2018-10-16 DIAGNOSIS — M5417 Radiculopathy, lumbosacral region: Secondary | ICD-10-CM | POA: Diagnosis not present

## 2018-10-16 DIAGNOSIS — M79606 Pain in leg, unspecified: Secondary | ICD-10-CM | POA: Diagnosis not present

## 2018-10-16 DIAGNOSIS — M961 Postlaminectomy syndrome, not elsewhere classified: Secondary | ICD-10-CM | POA: Diagnosis not present

## 2018-10-16 DIAGNOSIS — G894 Chronic pain syndrome: Secondary | ICD-10-CM | POA: Diagnosis not present

## 2018-11-13 DIAGNOSIS — Z79899 Other long term (current) drug therapy: Secondary | ICD-10-CM | POA: Diagnosis not present

## 2018-11-13 DIAGNOSIS — G894 Chronic pain syndrome: Secondary | ICD-10-CM | POA: Diagnosis not present

## 2018-11-13 DIAGNOSIS — M545 Low back pain: Secondary | ICD-10-CM | POA: Diagnosis not present

## 2018-11-13 DIAGNOSIS — Z79891 Long term (current) use of opiate analgesic: Secondary | ICD-10-CM | POA: Diagnosis not present

## 2018-11-13 DIAGNOSIS — M5417 Radiculopathy, lumbosacral region: Secondary | ICD-10-CM | POA: Diagnosis not present

## 2018-11-13 DIAGNOSIS — M961 Postlaminectomy syndrome, not elsewhere classified: Secondary | ICD-10-CM | POA: Diagnosis not present

## 2018-11-21 DIAGNOSIS — F411 Generalized anxiety disorder: Secondary | ICD-10-CM | POA: Diagnosis not present

## 2018-12-11 DIAGNOSIS — Z9889 Other specified postprocedural states: Secondary | ICD-10-CM | POA: Diagnosis not present

## 2018-12-11 DIAGNOSIS — M961 Postlaminectomy syndrome, not elsewhere classified: Secondary | ICD-10-CM | POA: Diagnosis not present

## 2018-12-11 DIAGNOSIS — G894 Chronic pain syndrome: Secondary | ICD-10-CM | POA: Diagnosis not present

## 2018-12-11 DIAGNOSIS — M5417 Radiculopathy, lumbosacral region: Secondary | ICD-10-CM | POA: Diagnosis not present

## 2019-01-09 DIAGNOSIS — M5417 Radiculopathy, lumbosacral region: Secondary | ICD-10-CM | POA: Diagnosis not present

## 2019-01-09 DIAGNOSIS — M961 Postlaminectomy syndrome, not elsewhere classified: Secondary | ICD-10-CM | POA: Diagnosis not present

## 2019-02-11 DIAGNOSIS — J029 Acute pharyngitis, unspecified: Secondary | ICD-10-CM | POA: Diagnosis not present

## 2019-02-11 DIAGNOSIS — Z03818 Encounter for observation for suspected exposure to other biological agents ruled out: Secondary | ICD-10-CM | POA: Diagnosis not present

## 2019-02-11 DIAGNOSIS — B09 Unspecified viral infection characterized by skin and mucous membrane lesions: Secondary | ICD-10-CM | POA: Diagnosis not present

## 2019-02-16 ENCOUNTER — Emergency Department (HOSPITAL_COMMUNITY)
Admission: EM | Admit: 2019-02-16 | Discharge: 2019-02-17 | Disposition: A | Discharge status: Home / Self Care | Payer: BLUE CROSS/BLUE SHIELD | Attending: Emergency Medicine | Admitting: Emergency Medicine

## 2019-02-16 ENCOUNTER — Encounter (HOSPITAL_COMMUNITY): Payer: Self-pay

## 2019-02-16 ENCOUNTER — Other Ambulatory Visit: Payer: Self-pay

## 2019-02-16 ENCOUNTER — Emergency Department (HOSPITAL_COMMUNITY): Payer: BLUE CROSS/BLUE SHIELD

## 2019-02-16 DIAGNOSIS — R29898 Other symptoms and signs involving the musculoskeletal system: Secondary | ICD-10-CM | POA: Insufficient documentation

## 2019-02-16 DIAGNOSIS — R0902 Hypoxemia: Secondary | ICD-10-CM | POA: Diagnosis not present

## 2019-02-16 DIAGNOSIS — R51 Headache: Secondary | ICD-10-CM | POA: Diagnosis not present

## 2019-02-16 DIAGNOSIS — R52 Pain, unspecified: Secondary | ICD-10-CM | POA: Diagnosis not present

## 2019-02-16 DIAGNOSIS — R000 Tachycardia, unspecified: Secondary | ICD-10-CM | POA: Diagnosis not present

## 2019-02-16 DIAGNOSIS — R4781 Slurred speech: Secondary | ICD-10-CM | POA: Diagnosis not present

## 2019-02-16 DIAGNOSIS — I639 Cerebral infarction, unspecified: Secondary | ICD-10-CM | POA: Diagnosis not present

## 2019-02-16 DIAGNOSIS — I10 Essential (primary) hypertension: Secondary | ICD-10-CM | POA: Insufficient documentation

## 2019-02-16 DIAGNOSIS — M6281 Muscle weakness (generalized): Secondary | ICD-10-CM | POA: Diagnosis not present

## 2019-02-16 DIAGNOSIS — Z79899 Other long term (current) drug therapy: Secondary | ICD-10-CM | POA: Insufficient documentation

## 2019-02-16 DIAGNOSIS — G43109 Migraine with aura, not intractable, without status migrainosus: Secondary | ICD-10-CM | POA: Diagnosis not present

## 2019-02-16 DIAGNOSIS — M452 Ankylosing spondylitis of cervical region: Secondary | ICD-10-CM | POA: Diagnosis not present

## 2019-02-16 DIAGNOSIS — S0990XA Unspecified injury of head, initial encounter: Secondary | ICD-10-CM | POA: Diagnosis not present

## 2019-02-16 DIAGNOSIS — H538 Other visual disturbances: Secondary | ICD-10-CM | POA: Diagnosis not present

## 2019-02-16 DIAGNOSIS — M50222 Other cervical disc displacement at C5-C6 level: Secondary | ICD-10-CM | POA: Diagnosis not present

## 2019-02-16 DIAGNOSIS — R519 Headache, unspecified: Secondary | ICD-10-CM

## 2019-02-16 LAB — COMPREHENSIVE METABOLIC PANEL
ALT: 118 U/L — ABNORMAL HIGH (ref 0–44)
AST: 87 U/L — ABNORMAL HIGH (ref 15–41)
Albumin: 4.1 g/dL (ref 3.5–5.0)
Alkaline Phosphatase: 87 U/L (ref 38–126)
Anion gap: 12 (ref 5–15)
BUN: 12 mg/dL (ref 6–20)
CO2: 21 mmol/L — ABNORMAL LOW (ref 22–32)
Calcium: 9.1 mg/dL (ref 8.9–10.3)
Chloride: 106 mmol/L (ref 98–111)
Creatinine, Ser: 1.13 mg/dL (ref 0.61–1.24)
GFR calc Af Amer: >60 mL/min (ref >60)
GFR calc non Af Amer: >60 mL/min (ref >60)
Glucose, Bld: 110 mg/dL — ABNORMAL HIGH (ref 70–99)
Potassium: 4.1 mmol/L (ref 3.5–5.1)
Sodium: 139 mmol/L (ref 135–145)
Total Bilirubin: 0.8 mg/dL (ref 0.3–1.2)
Total Protein: 7.3 g/dL (ref 6.5–8.1)

## 2019-02-16 LAB — TROPONIN I (HIGH SENSITIVITY)
Troponin I (High Sensitivity): 6 ng/L (ref <18)
Troponin I (High Sensitivity): 6 ng/L (ref <18)

## 2019-02-16 LAB — CBC WITH DIFFERENTIAL/PLATELET
Abs Immature Granulocytes: 0.12 10*3/uL — ABNORMAL HIGH (ref 0.00–0.07)
Basophils Absolute: 0.1 10*3/uL (ref 0.0–0.1)
Basophils Relative: 1 %
Eosinophils Absolute: 0.2 10*3/uL (ref 0.0–0.5)
Eosinophils Relative: 1 %
HCT: 49.7 % (ref 39.0–52.0)
Hemoglobin: 15.8 g/dL (ref 13.0–17.0)
Immature Granulocytes: 1 %
Lymphocytes Relative: 21 %
Lymphs Abs: 2.9 10*3/uL (ref 0.7–4.0)
MCH: 29.9 pg (ref 26.0–34.0)
MCHC: 31.8 g/dL (ref 30.0–36.0)
MCV: 94 fL (ref 80.0–100.0)
Monocytes Absolute: 1.3 10*3/uL — ABNORMAL HIGH (ref 0.1–1.0)
Monocytes Relative: 9 %
Neutro Abs: 9.5 10*3/uL — ABNORMAL HIGH (ref 1.7–7.7)
Neutrophils Relative %: 67 %
Platelets: 442 10*3/uL — ABNORMAL HIGH (ref 150–400)
RBC: 5.29 MIL/uL (ref 4.22–5.81)
RDW: 12.7 % (ref 11.5–15.5)
WBC: 14 10*3/uL — ABNORMAL HIGH (ref 4.0–10.5)
nRBC: 0 % (ref 0.0–0.2)

## 2019-02-16 LAB — PROTIME-INR
INR: 1.1 (ref 0.8–1.2)
Prothrombin Time: 13.8 seconds (ref 11.4–15.2)

## 2019-02-16 LAB — ETHANOL: Alcohol, Ethyl (B): <10 mg/dL (ref <10)

## 2019-02-16 MED ORDER — ACETAMINOPHEN 500 MG PO TABS
1000.0000 mg | ORAL_TABLET | Freq: Once | ORAL | Status: AC
  Administered 2019-02-16: 1000 mg via ORAL
  Filled 2019-02-16: qty 2

## 2019-02-16 MED ORDER — ONDANSETRON HCL 4 MG/2ML IJ SOLN
4.0000 mg | Freq: Once | INTRAMUSCULAR | Status: AC
  Administered 2019-02-16: 4 mg via INTRAVENOUS
  Filled 2019-02-16: qty 2

## 2019-02-16 NOTE — ED Provider Notes (Signed)
[facility] Provider Note   CSN: [id number] Arrival date & time: 02/16/19  1758     History   Chief Complaint No chief complaint on file.   HPI Paul Roman is a 48 y.o. male.     Patient is a 48 year old male with past medical history of hypertension, hyperlipidemia, and asthma.  He presents today for evaluation of headache and left arm weakness.  This apparently began yesterday.  He reports feeling a pain in his head followed by blurry vision, left arm weakness, and feeling off balance.  Symptoms did not improve into today and 911 was called and patient brought here.  He denies any fevers or chills.  He denies any injury or trauma.  The history is provided by the patient.    Past Medical History:  Diagnosis Date  . Asthma    mild during peak allery season  . Celiac disease   . GERD (gastroesophageal reflux disease)   . Hyperlipidemia   . Hypertension   . Mitral valve prolapse     Patient Active Problem List   Diagnosis Date Noted  . Chest pain 08/20/2015  . Essential hypertension 08/20/2015  . Mitral valve prolapse 08/20/2015  . Opioid abuse ([place]) 06/08/2012  . Opioid withdrawal ([place]) 06/08/2012  . Benzodiazepine abuse ([place]) 06/08/2012  . Substance induced mood disorder ([place]) 06/08/2012  . Osteoarthritis of right knee 02/29/2012    Past Surgical History:  Procedure Laterality Date  . BACK SURGERY  04/2007  . KNEE ARTHROPLASTY  02/28/2012   Procedure: COMPUTER ASSISTED TOTAL KNEE ARTHROPLASTY;  Surgeon: Alta Corning, MD;  Location: [facility];  Service: Orthopedics;  Laterality: Right;  right total knee arthroplasty  . KNEE ARTHROSCOPY     x2 R  . TONSILLECTOMY          Home Medications    Prior to Admission medications   Medication Sig Start Date End Date Taking? Authorizing Provider  albuterol (PROVENTIL HFA;VENTOLIN HFA) 108 (90 BASE) MCG/ACT inhaler Inhale 2 puffs into the lungs every 6 (six) hours as needed. For  shortness of breath    [provider]  ALPRAZolam (XANAX) 0.5 MG tablet Take 0.5 mg by mouth as needed.  08/13/15   [provider]  HYDROcodone-acetaminophen (NORCO) 10-325 MG tablet Take 1 tablet by mouth every 6 (six) hours as needed for moderate pain.  08/07/15   [provider]  loratadine (CLARITIN) 10 MG tablet Take 1 tablet (10 mg total) by mouth daily. 06/12/12   Patrecia Pour, NP  losartan (COZAAR) 100 MG tablet Take 100 mg by mouth daily.  06/21/15   [provider]    Family History Family History  Problem Relation Age of Onset  . Heart disease Mother        MI    Social History Social History   Tobacco Use  . Smoking status: Former Research scientist (life sciences)  . Smokeless tobacco: Never Used  Substance Use Topics  . Alcohol use: No    Comment: Pt abuses prescribed meds  . Drug use: Yes    Types: Oxycodone     Allergies   Ciprofloxacin, Penicillins, Sulfa antibiotics, Gluten meal, Glutethimides, Montelukast sodium, Montelukast, and Sulfamethoxazole   Review of Systems Review of Systems  All other systems reviewed and are negative.    Physical Exam Updated Vital Signs There were no vitals taken for this visit.  Physical Exam Vitals signs and nursing note reviewed.  Constitutional:  General: He is not in acute distress.    Appearance: He is well-developed. He is not diaphoretic.  HENT:     Head: Normocephalic and atraumatic.  Eyes:     Extraocular Movements: Extraocular movements intact.     Pupils: Pupils are equal, round, and reactive to light.  Neck:     Musculoskeletal: Normal range of motion and neck supple.  Cardiovascular:     Rate and Rhythm: Normal rate and regular rhythm.     Heart sounds: No murmur. No friction rub.  Pulmonary:     Effort: Pulmonary effort is normal. No respiratory distress.     Breath sounds: Normal breath sounds. No wheezing or rales.  Abdominal:     General: Bowel sounds are normal. There is no  distension.     Palpations: Abdomen is soft.     Tenderness: There is no abdominal tenderness.  Musculoskeletal: Normal range of motion.  Skin:    General: Skin is warm and dry.  Neurological:     Mental Status: He is alert and oriented to person, place, and time.     Comments: Patient is awake and alert.  He has 5 out of 5 strength to both lower extremities.  His left hand grip is significantly diminished and patient is unable to raise his arm.  The right arm strength is normal.  He has perhaps a slight left-sided facial droop.      ED Treatments / Results  Labs (all labs ordered are listed, but only abnormal results are displayed) Labs Reviewed  COMPREHENSIVE METABOLIC PANEL  ETHANOL  CBC WITH DIFFERENTIAL/PLATELET  PROTIME-INR  TROPONIN I (HIGH SENSITIVITY)    EKG ED ECG REPORT   Date: 02/16/2019  Rate: 106  Rhythm: sinus tachycardia  QRS Axis: left  Intervals: normal  ST/T Wave abnormalities: normal  Conduction Disutrbances:none  Narrative Interpretation:   Old EKG Reviewed: none available  I have personally reviewed the EKG tracing and agree with the computerized printout as noted.   Radiology No results found.  Procedures Procedures (including critical care time)  Medications Ordered in ED Medications - No data to display   Initial Impression / Assessment and Plan / ED Course  I have reviewed the triage vital signs and the nursing notes.  Pertinent labs & imaging results that were available during my care of the patient were reviewed by me and considered in my medical decision making (see chart for details).  Patient presents here with complaints of left arm weakness and headache that began yesterday.  On my exam, the patient has very little use of his left arm.  He is otherwise neurologically intact.  Patient has had an unremarkable head CT and laboratory studies that are also unremarkable.  He will undergo MRI of his brain and cervical spine to rule out  stroke and/or impingement.  Care will be signed out to Dr. Elesa MassedWard at shift change.  She will obtain the results of the studies and determine the final disposition.  Patient also evaluated by Dr. Otelia LimesLindzen from neurology.  He feels as though this presentation could represent a complex migraine.  Final Clinical Impressions(s) / ED Diagnoses   Final diagnoses:  None    ED Discharge Orders    None       Geoffery Lyonselo, Beverlee Wilmarth, MD 02/19/19 [phone]

## 2019-02-16 NOTE — ED Notes (Addendum)
Pt c/o sudden onset of blurred vision; describes as seeing smoke; Pt c/o HA 9/10; MD informed

## 2019-02-16 NOTE — ED Triage Notes (Signed)
Pt brought by [facility], at 0930 yesterday pt started to have "the worst headache of his life, left arm droop, flaccid left arm, decreased sensory of left arm, slurred speech, abnormal gait. Per ems PT'S PUPILS ARE UNEQUAL RIGHT PUPIL IS BIGGER THAN THE OTHER. Pt is A&Ox4. LKW 0915 yesterday. Pt states 10/10 throbbing head pain.

## 2019-02-17 ENCOUNTER — Emergency Department (HOSPITAL_COMMUNITY): Payer: BLUE CROSS/BLUE SHIELD

## 2019-02-17 DIAGNOSIS — G43109 Migraine with aura, not intractable, without status migrainosus: Secondary | ICD-10-CM | POA: Diagnosis not present

## 2019-02-17 DIAGNOSIS — M50222 Other cervical disc displacement at C5-C6 level: Secondary | ICD-10-CM | POA: Diagnosis not present

## 2019-02-17 DIAGNOSIS — I639 Cerebral infarction, unspecified: Secondary | ICD-10-CM | POA: Diagnosis not present

## 2019-02-17 DIAGNOSIS — M452 Ankylosing spondylitis of cervical region: Secondary | ICD-10-CM | POA: Diagnosis not present

## 2019-02-17 DIAGNOSIS — R51 Headache: Secondary | ICD-10-CM | POA: Diagnosis not present

## 2019-02-17 LAB — MAGNESIUM: Magnesium: 2 mg/dL (ref 1.7–2.4)

## 2019-02-17 MED ORDER — SODIUM CHLORIDE 0.9 % IV BOLUS (SEPSIS)
1000.0000 mL | Freq: Once | INTRAVENOUS | Status: AC
  Administered 2019-02-17: 1000 mL via INTRAVENOUS

## 2019-02-17 MED ORDER — METOCLOPRAMIDE HCL 5 MG/ML IJ SOLN
10.0000 mg | Freq: Once | INTRAMUSCULAR | Status: AC
  Administered 2019-02-17: 10 mg via INTRAVENOUS
  Filled 2019-02-17: qty 2

## 2019-02-17 MED ORDER — DIPHENHYDRAMINE HCL 50 MG/ML IJ SOLN
25.0000 mg | Freq: Once | INTRAMUSCULAR | Status: AC
  Administered 2019-02-17: 25 mg via INTRAVENOUS
  Filled 2019-02-17: qty 1

## 2019-02-17 MED ORDER — MAGNESIUM SULFATE IN D5W 1-5 GM/100ML-% IV SOLN: 1.0000 g | Freq: Once | INTRAVENOUS | Status: DC

## 2019-02-17 MED ORDER — LORAZEPAM 2 MG/ML IJ SOLN
1.0000 mg | Freq: Once | INTRAMUSCULAR | Status: AC
  Administered 2019-02-17: 07:00:00 1 mg via INTRAVENOUS
  Filled 2019-02-17: qty 1

## 2019-02-17 MED ORDER — ASPIRIN 81 MG PO CHEW
648.0000 mg | CHEWABLE_TABLET | Freq: Once | ORAL | Status: AC
  Administered 2019-02-17: 648 mg via ORAL
  Filled 2019-02-17: qty 8

## 2019-02-17 MED ORDER — KETOROLAC TROMETHAMINE 30 MG/ML IJ SOLN
30.0000 mg | Freq: Once | INTRAMUSCULAR | Status: AC
  Administered 2019-02-17: 30 mg via INTRAVENOUS
  Filled 2019-02-17: qty 1

## 2019-02-17 MED ORDER — PROMETHAZINE HCL 25 MG/ML IJ SOLN
25.0000 mg | Freq: Once | INTRAMUSCULAR | Status: AC
Start: 2019-02-17 — End: 2019-02-17
  Administered 2019-02-17: 25 mg via INTRAVENOUS
  Filled 2019-02-17: qty 1

## 2019-02-17 NOTE — ED Notes (Signed)
Ativan 1 mg (0.5 ml) wasted in sharps container; verified with Lowella Petties, RN.

## 2019-02-17 NOTE — ED Notes (Signed)
Patient verbalizes understanding of discharge instructions. Opportunity for questioning and answers were provided. Armband removed by staff, pt discharged from ED.  

## 2019-02-17 NOTE — ED Provider Notes (Signed)
Patient signed out to me at 7 AM awaiting MRI of cervical spine.  Patient has had left arm weakness for possibly over a day.  MRI of brain and MRA of brain unremarkable.  Lab work unremarkable.  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  Ruling out cervical spine injury.  Denies any trauma.  Neurology is aware of the patient.  They recommend that if MRI is negative that they follow-up outpatient with neurology.  Patient with MRI cervical spine that is normal.  On reevaluation patient is able to hold his arm up above his head.  He appears that some of this may be due to a factitious disorder.  Maybe it was due to a complex migraine as he has had improvement following medications.  Overall there is no acute emergency found and patient can follow-up with neurology as previously planned.  Discharged in the ED in good condition.  This chart was dictated using voice recognition software.  Despite best efforts to proofread,  errors can occur which can change the documentation meaning.     Lennice Sites, DO 02/17/19 [phone]

## 2019-02-17 NOTE — Discharge Instructions (Signed)
You may alternate Tylenol 1000 mg every 6 hours as needed for pain and Ibuprofen 800 mg every 8 hours as needed for pain.  Please take Ibuprofen with food. ° °

## 2019-02-17 NOTE — Consult Note (Addendum)
NEURO HOSPITALIST CONSULT NOTE   Requestig physician: Dr. Stark Jock  Reason for Consult: Severe headache with left sided weakness  History obtained from:  Patient and Chart    HPI:                                                                                                                                          Paul Roman is an 48 y.o. male who presented to the ED via EMS for severe retroorbital headache on the right, radiating to the entire forehead, in conjunction with new onset of left sided weakness. The patient has a history of migraines about 20-30 years ago, but has not had a migraine in years, although he does endorse frequent "sinus headaches". Yesterday at 0930 he started to have "the worst headache of my life" accompanied by flashing lights in his entire visual fields. The headache is throbbing, but there is no photophobia, sonophobia or osmophobia. He started to experience LUE weakness soon after the headache started, which progressively worsened throughout the day; weakness eventually also involved his LLE. The symptoms continued to worsen, with onset of gait unsteadiness as well. He was confused per wife and had slurred speech. He also endorsed decreased LUE sensation. No right sided symptoms. In triage, his pupils were asymmetric, R > L. He was alert and oriented x 4 in triage, complaining of 10/10 throbbing head pain. He has no prior history of complicated migraine or stroke.     Past Medical History:  Diagnosis Date  . Asthma    mild during peak allery season  . Celiac disease   . GERD (gastroesophageal reflux disease)   . Hyperlipidemia   . Hypertension   . Mitral valve prolapse     Past Surgical History:  Procedure Laterality Date  . BACK SURGERY  04/2007  . KNEE ARTHROPLASTY  02/28/2012   Procedure: COMPUTER ASSISTED TOTAL KNEE ARTHROPLASTY;  Surgeon: Alta Corning, MD;  Location: [facility];  Service: Orthopedics;  Laterality: Right;  right total  knee arthroplasty  . KNEE ARTHROSCOPY     x2 R  . SPINAL CORD STIMULATOR INSERTION    . TONSILLECTOMY      Family History  Problem Relation Age of Onset  . Heart disease Mother        MI              Social History:  reports that he has quit smoking. He has never used smokeless tobacco. He reports current drug use. Drug: Oxycodone. He reports that he does not drink alcohol.  Allergies  Allergen Reactions  . Ciprofloxacin Shortness Of Breath and Swelling  . Penicillins Rash    Did it involve swelling of the face/tongue/throat, SOB, or low BP? No Did it involve sudden or severe  rash/hives, skin peeling, or any reaction on the inside of your mouth or nose? Yes Did you need to seek medical attention at a hospital or doctor's office? Yes When did it last happen?48 yrs old If all above answers are "NO", may proceed with cephalosporin use.  . Sulfa Antibiotics Rash  . Gluten Meal Nausea And Vomiting  . Montelukast Rash  . Sulfamethoxazole Rash    HOME MEDICATIONS:                                                                                                                        ROS:                                                                                                                                       Blurred vision without visual field cut. Comprehensive ROS otherwise as per HPI with all other systems negative.    Blood pressure (!) 153/111, pulse 93, temperature 98.4 F (36.9 C), temperature source Oral, resp. rate (!) 23, height 5\' 10"  (1.778 m), weight 102.1 kg, SpO2 96 %.   General Examination:                                                                                                       Physical Exam  HEENT-  [place]/AT    Lungs- Respirations unlabored Extremities- No edema  Neurological Examination Mental Status: Alert, oriented, thought content appropriate.  Speech fluent without evidence of receptive or expressive aphasia.  Able to follow all  commands without difficulty. Unusual combination of anxious prosodic content to speech in conjunction with flattened facial expressivity. Mild dysarthria noted.  Cranial Nerves: II: Pupils equal. Constricted visual fields in all 4 quadrants bilaterally, worse on the left.   III,IV, VI: No ptosis. EOM are full; 1 beat of vertical nystagmus is seen with upgaze.  V,VII: Subtle decreased left facial tone, lower quadrant. Decreased temp sensation on the left.  VIII: hearing intact to voice IX,X:  Voice is hoarse (baseline per wife) XI: Head is midline XII: midline tongue extension Motor: RUE and RLE 5/5 LUE with inability to elevate antigravity. This is seen in conjunction with unusual combination of increased contraction of proximal and distal muscles except for hand grip and hand intrinsics, which occurs intermittently and tends to be in muscle groups that are antagonistic to the muscles specifically being tested; effortful affect is noted as well - this suggests possible embellishment.  LLE 4+/5 proximally and distally with exception of 0/5 ankle dorsiflexion Sensory: Deccreased temp sensation LUE and LLE Deep Tendon Reflexes:  2+ right brachioradialis, biceps and patellar reflexes 3+ left brachioradialis and biceps; 4+ left patellar (crossed adductor) Plantars: Right: downgoing   Left: downgoing Cerebellar: No ataxia with FNF on right. Unable to perform on the left.  Gait: Deferred   Lab Results: Basic Metabolic Panel: Recent Labs  Lab 02/16/19 1822  NA 139  K 4.1  CL 106  CO2 21*  GLUCOSE 110*  BUN 12  CREATININE 1.13  CALCIUM 9.1    CBC: Recent Labs  Lab 02/16/19 1822  WBC 14.0*  NEUTROABS 9.5*  HGB 15.8  HCT 49.7  MCV 94.0  PLT 442*    Cardiac Enzymes: No results for input(s): CKTOTAL, CKMB, CKMBINDEX, TROPONINI in the last 168 hours.  Lipid Panel: No results for input(s): CHOL, TRIG, HDL, CHOLHDL, VLDL, LDLCALC in the last 168 hours.  Imaging: Ct Head Wo  Contrast  Result Date: 02/16/2019 CLINICAL DATA:  48 year old male with focal neurologic deficit. Head trauma. Ataxia and fall yesterday. History of recent subarachnoid hemorrhage. EXAM: CT HEAD WITHOUT CONTRAST TECHNIQUE: Contiguous axial images were obtained from the base of the skull through the vertex without intravenous contrast. COMPARISON:  Head CT dated 06/09/2012 FINDINGS: Brain: The ventricles and sulci appropriate size for patient's age. The gray-white matter discrimination is preserved. There is no acute intracranial hemorrhage. No mass effect or midline shift. No extra-axial fluid collection. Vascular: No hyperdense vessel or unexpected calcification. Skull: Normal. Negative for fracture or focal lesion. Sinuses/Orbits: No acute finding. Other: None IMPRESSION: Removal unremarkable noncontrast CT of the brain. Electronically Signed   By: Elgie CollardArash  Radparvar M.D.   On: 02/16/2019 22:28    Assessment:  48 y.o. male who presented to the ED via EMS for severe throbbing retroorbital headache on the right, radiating to the entire forehead, in conjunction with new onset of left sided weakness, gait instability, asymmetric pupils, confusion and dysarthria. 1. Most likely component of the DDx is complicated migraine. Also possible but less likely would be subacute ischemic infarction.  2. CT head is negative.  3. Exam reveals left sided motor and sensory impairments.   Recommendations: 1. MRI and MRA of the head.  2. 650 mg ASA crushed x 1.  3. Phenergan 25 mg IV x 1.     Electronically signed: Dr. Caryl PinaEric Neima Lacross 02/17/2019, 12:41 AM

## 2019-02-17 NOTE — ED Notes (Signed)
Pt returned to room from MRI.

## 2019-02-17 NOTE — ED Notes (Signed)
Patient transported to MRI 

## 2019-02-17 NOTE — ED Provider Notes (Signed)
12:45 AM Assumed care from Dr. Stark Jock.  Patient is a 48 year old male who presents to the emergency department with headache, left arm weakness and numbness and blurry vision.  Seen by Dr. Cheral Marker with neurology.  Differential includes complex migraine versus stroke.  Patient has history of hypertension and hyperlipidemia.  Symptoms started 24 hours ago.  Plan is for MRI and MRA of the brain.  Will give IV Phenergan per neurology recommendations for possible complex migraine.  Has had history of migraines years ago but never had history of complex migraines.  On my examination, patient has an is complete flaccid paralysis of the left upper extremity.  He reports his entire left arm is numb.  He is not having any neck pain.  Also feels like the left leg is slightly weak.  5:10 AM  Pt's headache is now a 5/10 after IV Phenergan.  He still has almost complete flaccid paralysis of the left upper extremity.  His MRI shows no acute abnormality.  Will give Toradol, Reglan and Benadryl as neurology thought that this could be a complex migraine.  Will update neurology with MRI findings.  Patient also complaining of leg cramps.  Will obtain magnesium level.  Potassium was 4.1.  5:28 AM  D/w Dr. Cheral Marker with neurology.  Appreciate his help.  I discussed my concern that patient arm seems to have flaccid paralysis.  He reports on his examination patient would not move the arm but had increased tone.  On my reevaluation patient will not lift the arm but when passively ranged, he does appear to have increased abnormal tone.  I am concerned that this could be cervical spine issue versus factitious disorder versus complex migraine.  Neurology recommends MRI of the cervical spine without contrast.  Magnesium level normal at 2.0.  Dr. Cheral Marker agrees with giving Toradol, Reglan, Benadryl and IV fluids.  States that if this does not improve his symptoms we can give 1 g of magnesium or 30 minutes of 100% oxygen with nonrebreather for  headache relief.  If MRI of the cervical spine shows no acute emergent pathology, patient will be discharged with outpatient neurology follow-up.  Discussed this plan with patient and his wife who are in agreement.  7:20 AM  Signed out to Dr. Ronnald Nian.  I reviewed all nursing notes, vitals, pertinent previous records, EKGs, lab and urine results, imaging (as available).    Desmon Hitchner, Delice Bison, DO 02/17/19 [phone]

## 2019-02-22 DIAGNOSIS — G894 Chronic pain syndrome: Secondary | ICD-10-CM | POA: Diagnosis not present

## 2019-02-22 DIAGNOSIS — Z79899 Other long term (current) drug therapy: Secondary | ICD-10-CM | POA: Diagnosis not present

## 2019-02-22 DIAGNOSIS — M47818 Spondylosis without myelopathy or radiculopathy, sacral and sacrococcygeal region: Secondary | ICD-10-CM | POA: Diagnosis not present

## 2019-02-22 DIAGNOSIS — Z79891 Long term (current) use of opiate analgesic: Secondary | ICD-10-CM | POA: Diagnosis not present

## 2019-03-21 DIAGNOSIS — M25559 Pain in unspecified hip: Secondary | ICD-10-CM | POA: Diagnosis not present

## 2019-03-21 DIAGNOSIS — M5417 Radiculopathy, lumbosacral region: Secondary | ICD-10-CM | POA: Diagnosis not present

## 2019-03-21 DIAGNOSIS — M961 Postlaminectomy syndrome, not elsewhere classified: Secondary | ICD-10-CM | POA: Diagnosis not present

## 2019-03-21 DIAGNOSIS — G894 Chronic pain syndrome: Secondary | ICD-10-CM | POA: Diagnosis not present

## 2019-04-10 DIAGNOSIS — J069 Acute upper respiratory infection, unspecified: Secondary | ICD-10-CM | POA: Diagnosis not present

## 2019-04-10 DIAGNOSIS — Z03818 Encounter for observation for suspected exposure to other biological agents ruled out: Secondary | ICD-10-CM | POA: Diagnosis not present

## 2019-04-10 DIAGNOSIS — J189 Pneumonia, unspecified organism: Secondary | ICD-10-CM | POA: Diagnosis not present

## 2019-04-13 DIAGNOSIS — J189 Pneumonia, unspecified organism: Secondary | ICD-10-CM | POA: Diagnosis not present

## 2019-04-18 DIAGNOSIS — J189 Pneumonia, unspecified organism: Secondary | ICD-10-CM | POA: Diagnosis not present

## 2019-04-18 DIAGNOSIS — R05 Cough: Secondary | ICD-10-CM | POA: Diagnosis not present

## 2019-04-18 DIAGNOSIS — Z20828 Contact with and (suspected) exposure to other viral communicable diseases: Secondary | ICD-10-CM | POA: Diagnosis not present

## 2019-04-22 DIAGNOSIS — Z20828 Contact with and (suspected) exposure to other viral communicable diseases: Secondary | ICD-10-CM | POA: Diagnosis not present

## 2019-05-24 DIAGNOSIS — R110 Nausea: Secondary | ICD-10-CM | POA: Diagnosis not present

## 2019-05-24 DIAGNOSIS — G43009 Migraine without aura, not intractable, without status migrainosus: Secondary | ICD-10-CM | POA: Diagnosis not present

## 2019-05-24 DIAGNOSIS — F411 Generalized anxiety disorder: Secondary | ICD-10-CM | POA: Diagnosis not present

## 2019-08-31 DIAGNOSIS — R1013 Epigastric pain: Secondary | ICD-10-CM | POA: Diagnosis not present

## 2019-08-31 DIAGNOSIS — R110 Nausea: Secondary | ICD-10-CM | POA: Diagnosis not present

## 2019-08-31 DIAGNOSIS — K920 Hematemesis: Secondary | ICD-10-CM | POA: Diagnosis not present

## 2019-08-31 DIAGNOSIS — K900 Celiac disease: Secondary | ICD-10-CM | POA: Diagnosis not present

## 2019-09-22 IMAGING — MR MRI HEAD WITHOUT CONTRAST
10 of 12 series · 31 of 48 positions shown · non-contrast
Comparison: None.

CLINICAL DATA: Retro-orbital headache and new onset left-sided
weakness

EXAM:
MRI HEAD WITHOUT CONTRAST
MRA HEAD WITHOUT CONTRAST
TECHNIQUE: Multiplanar, multiecho pulse sequences of the brain and surrounding
structures were obtained without intravenous contrast. Angiographic
images of the head were obtained using MRA technique without
contrast.

[Series 4: DWI · axial · 3.0mm · 1.09mm/px · z∈[-46,+110]mm · 6 of 106 slices shown (1 of 4)]
[im 1/106]
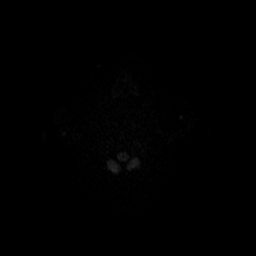
[im 22/106]
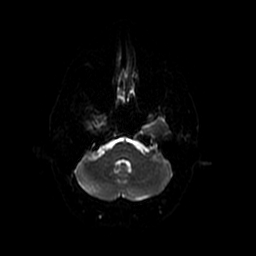
[im 43/106]
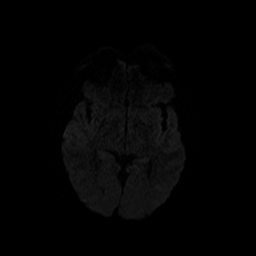
[im 64/106]
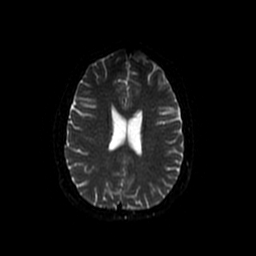
[im 85/106]
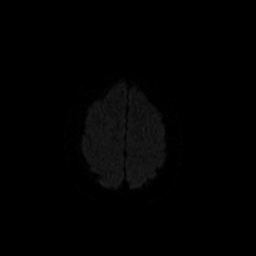
[im 106/106]
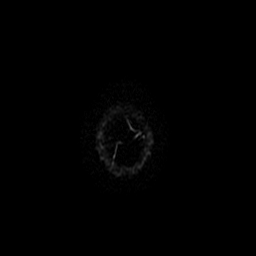

[Series 5: DWI · coronal · 5.0mm · 1.09mm/px · 5 of 76 slices shown (2 of 4)]
[im 1/76]
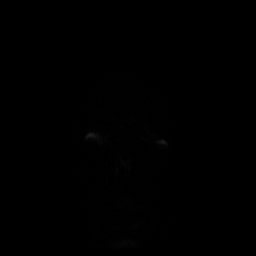
[im 19/76]
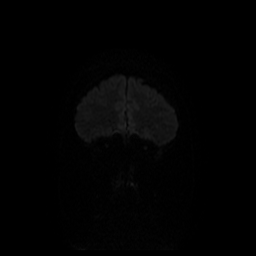
[im 38/76]
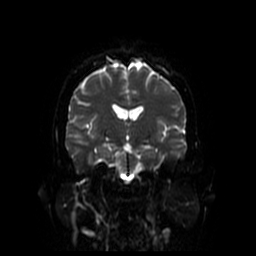
[im 57/76]
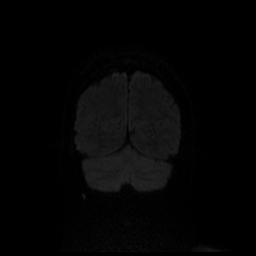
[im 76/76]
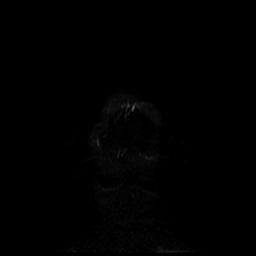

[Series 7: T1 · sagittal · 5.0mm · 0.47mm/px · 2 of 27 slices shown (1 of 3)]
[im 1/27]
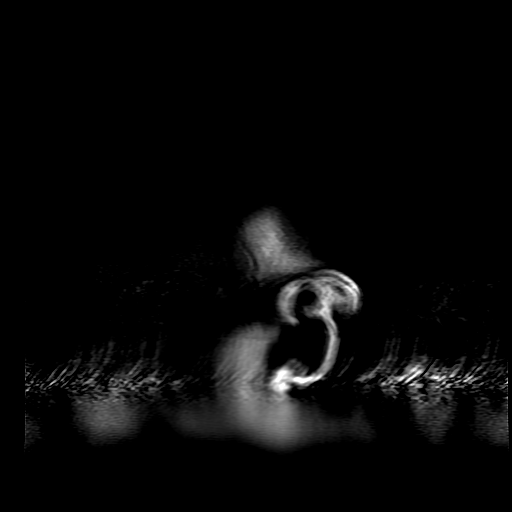
[im 27/27]
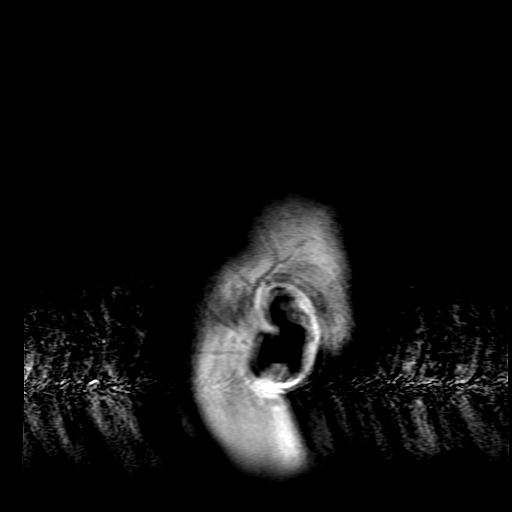

[Series 8: T1 · sagittal · 5.0mm · 0.47mm/px · 2 of 27 slices shown (2 of 3)]
[im 1/27]
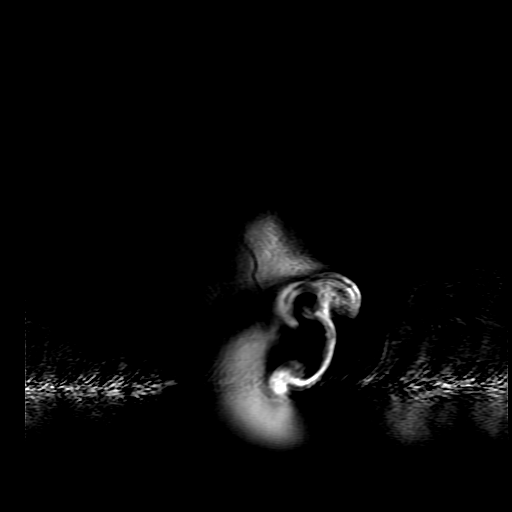
[im 27/27]
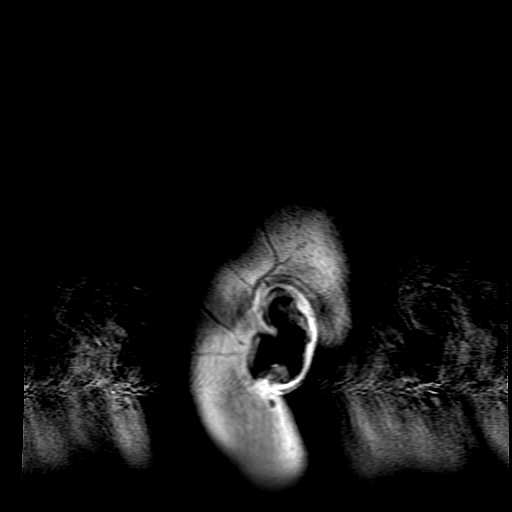

[Series 9: T2 · axial · 5.0mm · 0.43mm/px · z∈[-58,+102]mm · 2 of 28 slices shown (1 of 2)]
[im 1/28]
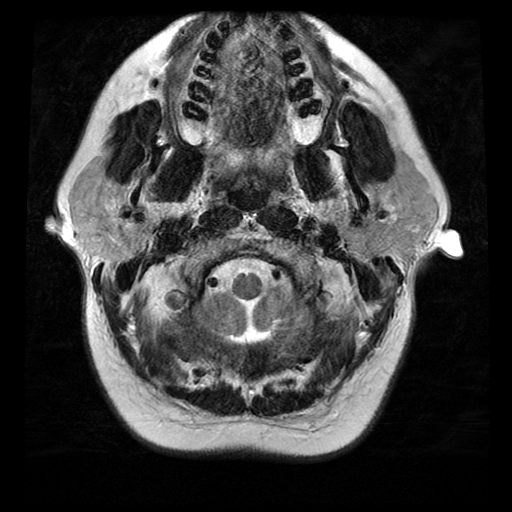
[im 28/28]
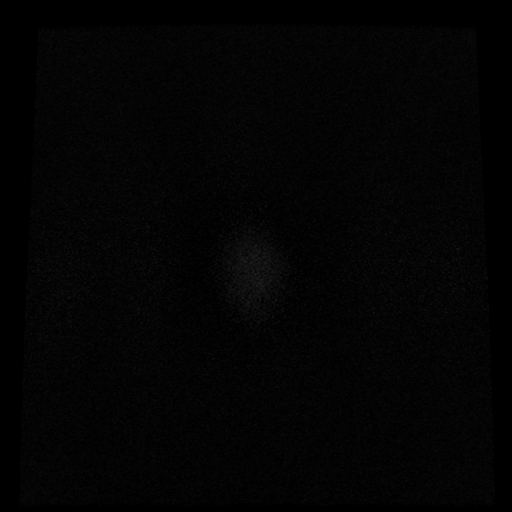

[Series 10: FLAIR · axial · 3.0mm · 0.43mm/px · z∈[-50,+106]mm · 2 of 27 slices shown]
[im 1/27]
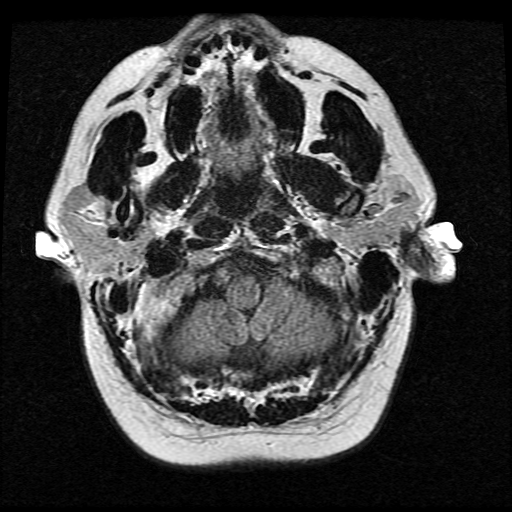
[im 27/27]
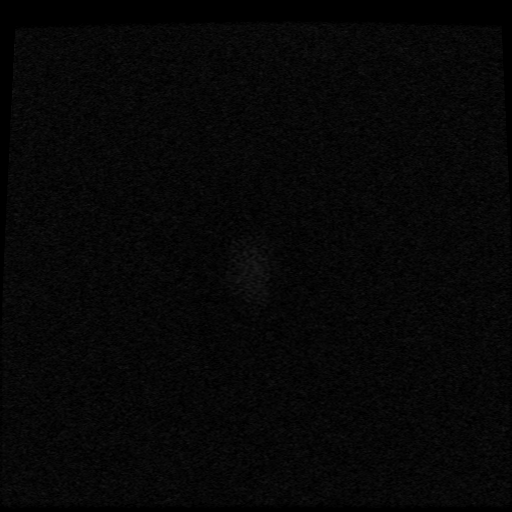

[Series 12: T1 · axial · 3.0mm · 0.47mm/px · z∈[-65,-20]mm · 3 of 108 slices shown (3 of 3)]
[im 1/108]
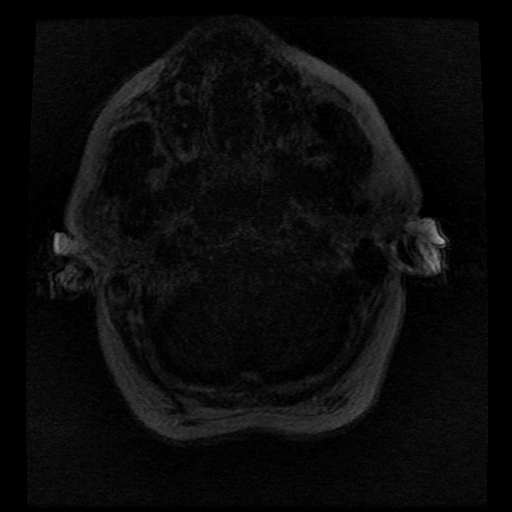
[im 16/108]
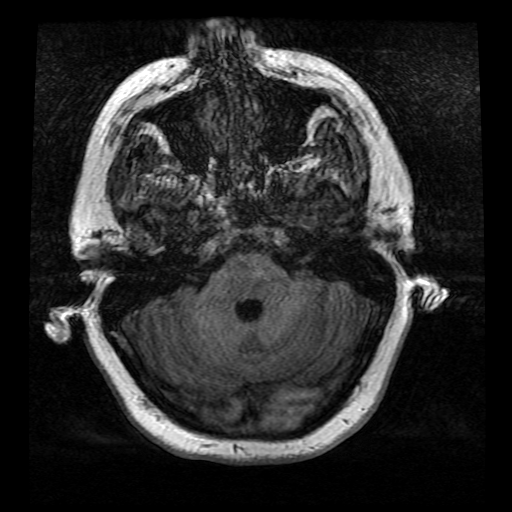
[im 31/108]
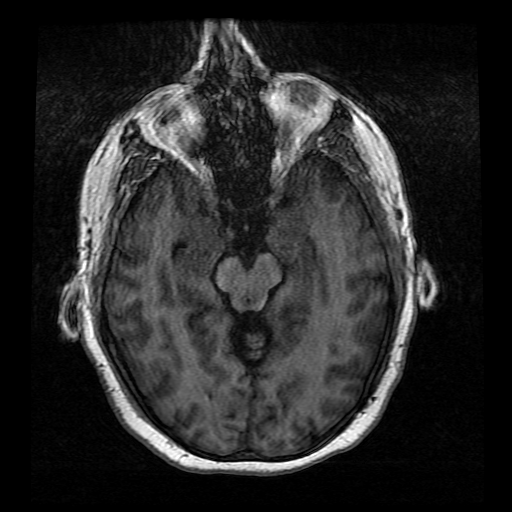

[Series 13: T2 · coronal · 5.0mm · 0.39mm/px · 2 of 30 slices shown (2 of 2)]
[im 1/30]
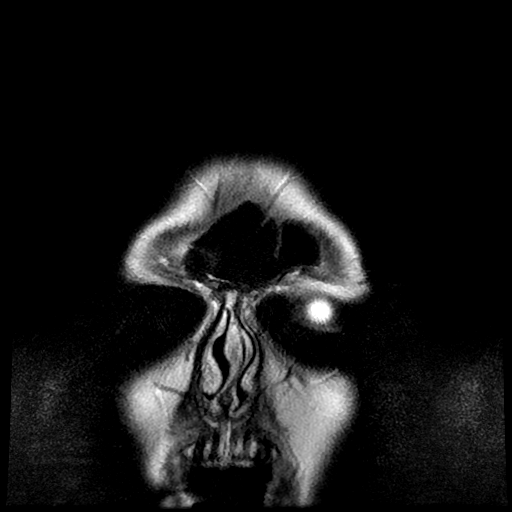
[im 30/30]
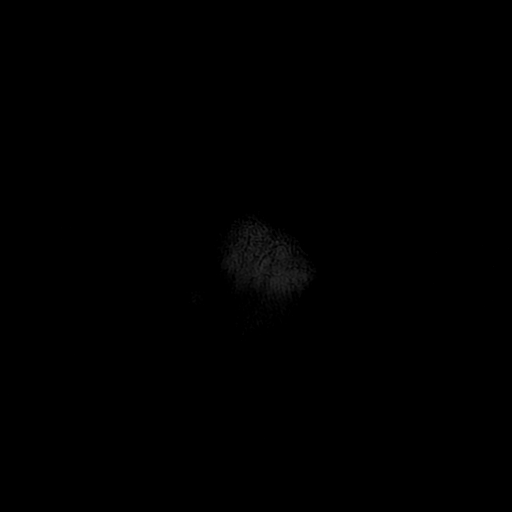

[Series 400: DWI · axial · 3.0mm · 1.09mm/px · z∈[-46,+110]mm · 4 of 53 slices shown (3 of 4)]
[im 1/53]
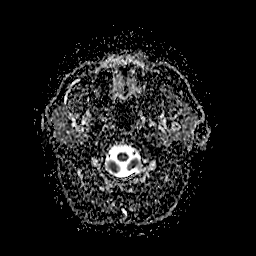
[im 18/53]
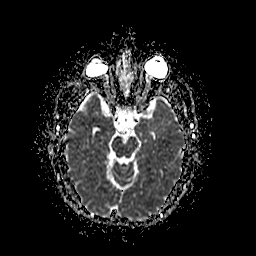
[im 35/53]
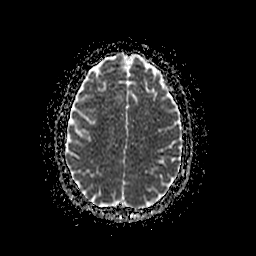
[im 53/53]
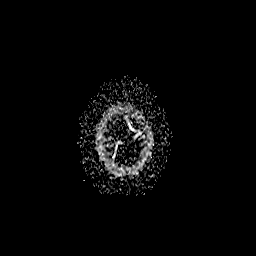

[Series 500: DWI · coronal · 5.0mm · 1.09mm/px · 3 of 38 slices shown (4 of 4)]
[im 1/38]
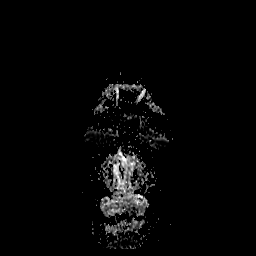
[im 19/38]
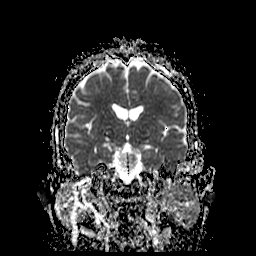
[im 38/38]
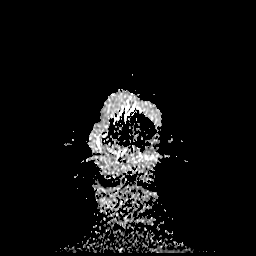

[31 of 48 positions shown; findings below may reference images not displayed]

FINDINGS: MRI HEAD FINDINGS

BRAIN: There is no acute infarct, acute hemorrhage or extra-axial
collection. The white matter signal is normal for the patient's age.
The cerebral and cerebellar volume are age-appropriate. There is no
hydrocephalus. The midline structures are normal.

VASCULAR: Susceptibility-sensitive sequences show no chronic
microhemorrhage or superficial siderosis. The major intracranial
arterial and venous sinus flow voids are normal.

SKULL AND UPPER CERVICAL SPINE: Calvarial bone marrow signal is
normal. There is no skull base mass. The visualized upper cervical
spine and soft tissues are normal.

SINUSES/ORBITS: There are no fluid levels or advanced mucosal
thickening. The mastoid air cells and middle ear cavities are free
of fluid. The orbits are normal.

MRA HEAD FINDINGS

POSTERIOR CIRCULATION:

Limited visualization of the lower posterior circulation due to
motion.

--Basilar artery: Normal.

--Superior cerebellar arteries: Normal.

--Posterior cerebral arteries (PCA): Normal. Both originate from the
basilar artery. Posterior communicating arteries (p-comm) are
diminutive or absent.

ANTERIOR CIRCULATION:

--Intracranial internal carotid arteries: Normal.

--Anterior cerebral arteries (ACA): Normal. Both A1 segments are
present. Patent anterior communicating artery (a-comm).

--Middle cerebral arteries (MCA): Normal.
IMPRESSION: Normal MRI/MRA of the brain.

## 2019-10-22 DIAGNOSIS — J069 Acute upper respiratory infection, unspecified: Secondary | ICD-10-CM | POA: Diagnosis not present

## 2019-10-22 DIAGNOSIS — Z03818 Encounter for observation for suspected exposure to other biological agents ruled out: Secondary | ICD-10-CM | POA: Diagnosis not present

## 2020-01-29 DIAGNOSIS — R1084 Generalized abdominal pain: Secondary | ICD-10-CM | POA: Diagnosis not present

## 2020-01-29 DIAGNOSIS — R194 Change in bowel habit: Secondary | ICD-10-CM | POA: Diagnosis not present

## 2020-01-29 DIAGNOSIS — K900 Celiac disease: Secondary | ICD-10-CM | POA: Diagnosis not present

## 2020-01-29 DIAGNOSIS — R112 Nausea with vomiting, unspecified: Secondary | ICD-10-CM | POA: Diagnosis not present

## 2020-02-11 DIAGNOSIS — R112 Nausea with vomiting, unspecified: Secondary | ICD-10-CM | POA: Diagnosis not present

## 2020-02-11 DIAGNOSIS — K259 Gastric ulcer, unspecified as acute or chronic, without hemorrhage or perforation: Secondary | ICD-10-CM | POA: Diagnosis not present

## 2020-02-11 DIAGNOSIS — K2950 Unspecified chronic gastritis without bleeding: Secondary | ICD-10-CM | POA: Diagnosis not present

## 2020-02-11 DIAGNOSIS — K269 Duodenal ulcer, unspecified as acute or chronic, without hemorrhage or perforation: Secondary | ICD-10-CM | POA: Diagnosis not present

## 2020-02-13 DIAGNOSIS — K760 Fatty (change of) liver, not elsewhere classified: Secondary | ICD-10-CM | POA: Diagnosis not present

## 2020-02-13 DIAGNOSIS — R7989 Other specified abnormal findings of blood chemistry: Secondary | ICD-10-CM | POA: Diagnosis not present

## 2020-02-21 ENCOUNTER — Encounter (HOSPITAL_BASED_OUTPATIENT_CLINIC_OR_DEPARTMENT_OTHER): Payer: Self-pay | Admitting: Emergency Medicine

## 2020-02-21 ENCOUNTER — Emergency Department (HOSPITAL_BASED_OUTPATIENT_CLINIC_OR_DEPARTMENT_OTHER)
Admission: EM | Admit: 2020-02-21 | Discharge: 2020-02-21 | Disposition: A | Discharge status: Home / Self Care | Payer: BC Managed Care – PPO | Attending: Emergency Medicine | Admitting: Emergency Medicine

## 2020-02-21 ENCOUNTER — Emergency Department (HOSPITAL_BASED_OUTPATIENT_CLINIC_OR_DEPARTMENT_OTHER): Payer: BC Managed Care – PPO

## 2020-02-21 ENCOUNTER — Other Ambulatory Visit: Payer: Self-pay

## 2020-02-21 DIAGNOSIS — Z7951 Long term (current) use of inhaled steroids: Secondary | ICD-10-CM | POA: Insufficient documentation

## 2020-02-21 DIAGNOSIS — Z87891 Personal history of nicotine dependence: Secondary | ICD-10-CM | POA: Diagnosis not present

## 2020-02-21 DIAGNOSIS — K4090 Unilateral inguinal hernia, without obstruction or gangrene, not specified as recurrent: Secondary | ICD-10-CM | POA: Diagnosis not present

## 2020-02-21 DIAGNOSIS — R1110 Vomiting, unspecified: Secondary | ICD-10-CM | POA: Diagnosis not present

## 2020-02-21 DIAGNOSIS — R1084 Generalized abdominal pain: Secondary | ICD-10-CM | POA: Diagnosis not present

## 2020-02-21 DIAGNOSIS — J45909 Unspecified asthma, uncomplicated: Secondary | ICD-10-CM | POA: Insufficient documentation

## 2020-02-21 DIAGNOSIS — R1115 Cyclical vomiting syndrome unrelated to migraine: Secondary | ICD-10-CM | POA: Diagnosis not present

## 2020-02-21 DIAGNOSIS — I700 Atherosclerosis of aorta: Secondary | ICD-10-CM | POA: Diagnosis not present

## 2020-02-21 DIAGNOSIS — R1030 Lower abdominal pain, unspecified: Secondary | ICD-10-CM | POA: Diagnosis not present

## 2020-02-21 DIAGNOSIS — Z79899 Other long term (current) drug therapy: Secondary | ICD-10-CM | POA: Insufficient documentation

## 2020-02-21 DIAGNOSIS — Q7649 Other congenital malformations of spine, not associated with scoliosis: Secondary | ICD-10-CM | POA: Diagnosis not present

## 2020-02-21 DIAGNOSIS — I10 Essential (primary) hypertension: Secondary | ICD-10-CM | POA: Insufficient documentation

## 2020-02-21 DIAGNOSIS — K529 Noninfective gastroenteritis and colitis, unspecified: Secondary | ICD-10-CM | POA: Diagnosis not present

## 2020-02-21 LAB — CBC WITH DIFFERENTIAL/PLATELET
Abs Immature Granulocytes: 0.04 10*3/uL (ref 0.00–0.07)
Basophils Absolute: 0.1 10*3/uL (ref 0.0–0.1)
Basophils Relative: 1 %
Eosinophils Absolute: 0.3 10*3/uL (ref 0.0–0.5)
Eosinophils Relative: 3 %
HCT: 48.4 % (ref 39.0–52.0)
Hemoglobin: 15.5 g/dL (ref 13.0–17.0)
Immature Granulocytes: 0 %
Lymphocytes Relative: 27 %
Lymphs Abs: 2.6 10*3/uL (ref 0.7–4.0)
MCH: 28.4 pg (ref 26.0–34.0)
MCHC: 32 g/dL (ref 30.0–36.0)
MCV: 88.8 fL (ref 80.0–100.0)
Monocytes Absolute: 0.7 10*3/uL (ref 0.1–1.0)
Monocytes Relative: 7 %
Neutro Abs: 5.9 10*3/uL (ref 1.7–7.7)
Neutrophils Relative %: 62 %
Platelets: 337 10*3/uL (ref 150–400)
RBC: 5.45 MIL/uL (ref 4.22–5.81)
RDW: 12.4 % (ref 11.5–15.5)
WBC: 9.6 10*3/uL (ref 4.0–10.5)
nRBC: 0 % (ref 0.0–0.2)

## 2020-02-21 LAB — COMPREHENSIVE METABOLIC PANEL
ALT: 59 U/L — ABNORMAL HIGH (ref 0–44)
AST: 83 U/L — ABNORMAL HIGH (ref 15–41)
Albumin: 4.1 g/dL (ref 3.5–5.0)
Alkaline Phosphatase: 116 U/L (ref 38–126)
Anion gap: 12 (ref 5–15)
BUN: 10 mg/dL (ref 6–20)
CO2: 26 mmol/L (ref 22–32)
Calcium: 9 mg/dL (ref 8.9–10.3)
Chloride: 100 mmol/L (ref 98–111)
Creatinine, Ser: 0.91 mg/dL (ref 0.61–1.24)
GFR calc Af Amer: >60 mL/min (ref >60)
GFR calc non Af Amer: >60 mL/min (ref >60)
Glucose, Bld: 101 mg/dL — ABNORMAL HIGH (ref 70–99)
Potassium: 3.9 mmol/L (ref 3.5–5.1)
Sodium: 138 mmol/L (ref 135–145)
Total Bilirubin: 0.8 mg/dL (ref 0.3–1.2)
Total Protein: 7.6 g/dL (ref 6.5–8.1)

## 2020-02-21 LAB — URINALYSIS, ROUTINE W REFLEX MICROSCOPIC
Bilirubin Urine: NEGATIVE
Glucose, UA: NEGATIVE mg/dL
Hgb urine dipstick: NEGATIVE
Ketones, ur: NEGATIVE mg/dL
Leukocytes,Ua: NEGATIVE
Nitrite: NEGATIVE
Protein, ur: NEGATIVE mg/dL
Specific Gravity, Urine: 1.015 (ref 1.005–1.030)
pH: 5.5 (ref 5.0–8.0)

## 2020-02-21 LAB — RAPID URINE DRUG SCREEN, HOSP PERFORMED
Amphetamines: NOT DETECTED
Barbiturates: NOT DETECTED
Benzodiazepines: NOT DETECTED
Cocaine: NOT DETECTED
Opiates: NOT DETECTED
Tetrahydrocannabinol: NOT DETECTED

## 2020-02-21 LAB — LIPASE, BLOOD: Lipase: 37 U/L (ref 11–51)

## 2020-02-21 MED ORDER — IOHEXOL 300 MG/ML  SOLN
100.0000 mL | Freq: Once | INTRAMUSCULAR | Status: AC | PRN
  Administered 2020-02-21: 100 mL via INTRAVENOUS

## 2020-02-21 MED ORDER — METOCLOPRAMIDE HCL 5 MG/ML IJ SOLN
10.0000 mg | Freq: Once | INTRAMUSCULAR | Status: AC
  Administered 2020-02-21: 10 mg via INTRAVENOUS
  Filled 2020-02-21: qty 2

## 2020-02-21 MED ORDER — ONDANSETRON 4 MG PO TBDP: 4.0000 mg | ORAL_TABLET | Freq: Three times a day (TID) | ORAL | 1 refills | Status: AC | PRN

## 2020-02-21 MED ORDER — SODIUM CHLORIDE 0.9 % IV BOLUS
1000.0000 mL | Freq: Once | INTRAVENOUS | Status: AC
  Administered 2020-02-21: 1000 mL via INTRAVENOUS

## 2020-02-21 NOTE — ED Notes (Addendum)
Also c/o cough and shortness of breath x4 days ago. Reports relief with nausea meds but reports it returns when eating

## 2020-02-21 NOTE — ED Notes (Signed)
Prompted to provide urine sample, urinal provided 

## 2020-02-21 NOTE — ED Notes (Signed)
PO challenge given  

## 2020-02-21 NOTE — Discharge Instructions (Addendum)
Continue the Reglan.  Can supplement with the Zofran as needed.  Follow-up with your doctors.  Return for any new or worse symptoms.  Today's work-up without any acute findings.

## 2020-02-21 NOTE — ED Triage Notes (Signed)
Pt states he has been vomiting for 8 - 10 days  Pt states he has been to the dr and has been scoped and his dr said if he did not get any better to go to the hospital  Pt had his endoscopy on the 9th, and had a scan on the 11th

## 2020-02-21 NOTE — ED Provider Notes (Signed)
[facility] Provider Note: Lowella Dell, MD, FACEP  CSN: [id number] MRN: [id number] ARRIVAL: 02/21/20 at 0328 ROOM: [facility]   CHIEF COMPLAINT  Vomiting   HISTORY OF PRESENT ILLNESS  02/21/20 5:07 AM Paul Roman is a 49 y.o. male with a history of celiac disease on a gluten-free diet.  He has been having persistent vomiting for the last 8 to 10 days.  He has not been able to keep food or liquids down during that time.  He states when he attempts to eat or drink he develops a heartburn-like pain in his chest followed by vomiting.    He had an endoscopy on the ninth at [facility] which showed normal esophagus but erosions in the antrum which were biopsied for H. pylori.  Also noted were villous blunting and ulcerations in the duodenal bulb.  He was placed on omeprazole and Reglan.  He also had a right upper quadrant ultrasound on 02/13/2020 which showed hepatic steatosis, otherwise unremarkable.  Despite these treatments he continues to have the vomiting along with intermittent sharp pains of the abdomen which he rates as an 8 out of 10.  They are somewhat worse with movement and more prominent in the lower abdomen.  He denies diarrhea but states his stools have an abnormal odor (but he had Covid which has affected his senses of taste and smell).  He denies alcohol or marijuana use.  He has been on chronic Hysingla ER 30 mg in the past but not since 04/24/2019.   Past Medical History:  Diagnosis Date  . Asthma    mild during peak allery season  . Celiac disease   . GERD (gastroesophageal reflux disease)   . Hyperlipidemia   . Hypertension   . Mitral valve prolapse     Past Surgical History:  Procedure Laterality Date  . BACK SURGERY  04/2007  . KNEE ARTHROPLASTY  02/28/2012   Procedure: COMPUTER ASSISTED TOTAL KNEE ARTHROPLASTY;  Surgeon: Harvie Junior, MD;  Location: [facility] OR;  Service: Orthopedics;  Laterality: Right;  right total knee arthroplasty  . KNEE  ARTHROSCOPY     x2 R  . SPINAL CORD STIMULATOR INSERTION    . TONSILLECTOMY      Family History  Problem Relation Age of Onset  . Heart disease Mother        MI    Social History   Tobacco Use  . Smoking status: Former Games developer  . Smokeless tobacco: Never Used  Vaping Use  . Vaping Use: Never used  Substance Use Topics  . Alcohol use: No    Comment: Pt abuses prescribed meds  . Drug use: Not Currently    Types: Oxycodone    Prior to Admission medications   Medication Sig Start Date End Date Taking? Authorizing Provider  metoCLOPramide (REGLAN) 5 MG tablet Take by mouth. 02/11/20 02/10/21 Yes [provider]  omeprazole (PRILOSEC) 40 MG capsule Take by mouth. 01/29/20  Yes [provider]  ondansetron (ZOFRAN-ODT) 8 MG disintegrating tablet Take by mouth. 11/29/19  Yes [provider]  albuterol (PROVENTIL HFA;VENTOLIN HFA) 108 (90 BASE) MCG/ACT inhaler Inhale 2 puffs into the lungs every 6 (six) hours as needed for wheezing or shortness of breath (seasonal allergies).     [provider]  loratadine (CLARITIN) 10 MG tablet Take 1 tablet (10 mg total) by mouth daily. Patient taking differently: Take 10 mg by mouth daily as needed (seasonal allergies).  06/12/12   Charm Rings, NP  losartan (COZAAR) 100 MG tablet Take 100 mg by mouth at bedtime.  06/21/15   [provider]  MAGNESIUM PO Take 1 tablet by mouth daily as needed (muscle spasms).    [provider]  Multiple Vitamin (MULTIVITAMIN WITH MINERALS) TABS tablet Take 1 tablet by mouth daily.    [provider]  promethazine (PHENERGAN) 25 MG tablet SMARTSIG:1 Tablet(s) By Mouth Every 12 Hours PRN 12/10/19   [provider]  sertraline (ZOLOFT) 50 MG tablet Take 50 mg by mouth at bedtime. 01/21/19   [provider]    Allergies Ciprofloxacin, Penicillins, Sulfa antibiotics, Gluten meal, Montelukast, and Sulfamethoxazole   REVIEW OF SYSTEMS   Negative except as noted here or in the History of Present Illness.   PHYSICAL EXAMINATION  Initial Vital Signs Blood pressure (!) 129/105, pulse (!) 110, temperature 98.2 F (36.8 C), resp. rate 16, height 5\' 10"  (1.778 m), weight 98 kg, SpO2 97 %.  Examination General: Well-developed, well-nourished male in no acute distress; appearance consistent with age of record HENT: normocephalic; atraumatic Eyes: pupils equal, round and reactive to light; extraocular muscles intact Neck: supple Heart: regular rate and rhythm Lungs: clear to auscultation bilaterally Abdomen: soft; nondistended; diffuse lower tenderness; bowel sounds present Extremities: No deformity; full range of motion; pulses normal Neurologic: Awake, alert and oriented; motor function intact in all extremities and symmetric; no facial droop Skin: Warm and dry Psychiatric: Normal mood and affect   RESULTS  Summary of this visit's results, reviewed and interpreted by myself:   EKG Interpretation  Date/Time:    Ventricular Rate:    PR Interval:    QRS Duration:   QT Interval:    QTC Calculation:   R Axis:     Text Interpretation:        Laboratory Studies: Results for orders placed or performed during the hospital encounter of 02/21/20 (from the past 24 hour(s))  CBC with Differential/Platelet     Status: None   Collection Time: 02/21/20  5:20 AM  Result Value Ref Range   WBC 9.6 4.0 - 10.5 K/uL   RBC 5.45 4.22 - 5.81 MIL/uL   Hemoglobin 15.5 13.0 - 17.0 g/dL   HCT 02/23/20 39 - 52 %   MCV 88.8 80.0 - 100.0 fL   MCH 28.4 26.0 - 34.0 pg   MCHC 32.0 30.0 - 36.0 g/dL   RDW 37.9 02.4 - 09.7 %   Platelets 337 150 - 400 K/uL   nRBC 0.0 0.0 - 0.2 %   Neutrophils Relative % 62 %   Neutro Abs 5.9 1.7 - 7.7 K/uL   Lymphocytes Relative 27 %   Lymphs Abs 2.6 0.7 - 4.0 K/uL   Monocytes Relative 7 %   Monocytes Absolute 0.7 0 - 1 K/uL   Eosinophils Relative 3 %   Eosinophils Absolute 0.3 0 - 0 K/uL   Basophils  Relative 1 %   Basophils Absolute 0.1 0 - 0 K/uL   Immature Granulocytes 0 %   Abs Immature Granulocytes 0.04 0.00 - 0.07 K/uL  Comprehensive metabolic panel     Status: Abnormal   Collection Time: 02/21/20  5:20 AM  Result Value Ref Range   Sodium 138 135 - 145 mmol/L   Potassium 3.9 3.5 - 5.1 mmol/L   Chloride 100 98 - 111 mmol/L   CO2 26 22 - 32 mmol/L   Glucose, Bld 101 (H) 70 - 99 mg/dL   BUN 10 6 - 20 mg/dL   Creatinine,  Ser 0.91 0.61 - 1.24 mg/dL   Calcium 9.0 8.9 - 40.910.3 mg/dL   Total Protein 7.6 6.5 - 8.1 g/dL   Albumin 4.1 3.5 - 5.0 g/dL   AST 83 (H) 15 - 41 U/L   ALT 59 (H) 0 - 44 U/L   Alkaline Phosphatase 116 38 - 126 U/L   Total Bilirubin 0.8 0.3 - 1.2 mg/dL   GFR calc non Af Amer >60 >60 mL/min   GFR calc Af Amer >60 >60 mL/min   Anion gap 12 5 - 15  Lipase, blood     Status: None   Collection Time: 02/21/20  5:20 AM  Result Value Ref Range   Lipase 37 11 - 51 U/L   Imaging Studies: CT ABDOMEN PELVIS W CONTRAST  Result Date: 02/21/2020 CLINICAL DATA:  Emesis for 8-10 days, recent endoscopy 02/11/2020 EXAM: CT ABDOMEN AND PELVIS WITH CONTRAST TECHNIQUE: Multidetector CT imaging of the abdomen and pelvis was performed using the standard protocol following bolus administration of intravenous contrast. CONTRAST:  100mL OMNIPAQUE IOHEXOL 300 MG/ML  SOLN COMPARISON:  CT 04/01/2011 FINDINGS: Lower chest: Atelectatic changes in the otherwise clear lung bases. Normal heart size. No pericardial effusion. Few coronary artery calcifications. Hepatobiliary: Diffuse hepatic hypoattenuation compatible with fatty infiltration with areas of focal fatty sparing along the gallbladder fossa as well as a suspected region of sparing in the posterior right lobe periphery (2/25). No visible focal concerning liver lesion. Smooth liver surface contour. Gallbladder and biliary tree are unremarkable without visible calcified gallstone. Pancreas: Partial fatty replacement of the pancreas. No  pancreatic ductal dilatation or surrounding inflammatory changes. Spleen: Normal in size. No concerning splenic lesions. Adrenals/Urinary Tract: Normal adrenal glands. Kidneys enhance and excrete symmetrically. No concerning renal mass, urolithiasis or hydronephrosis. Circumferential bladder wall thickening is present. Finding is somewhat greater than expected for underdistention. Stomach/Bowel: Distal esophagus, stomach and duodenum are unremarkable. No small bowel thickening or dilatation. Ovoid radiodensity in the bowel in the left lower quadrant (2/60) likely medication capsule. Multiple fluid-filled loops of small bowel are nonspecific. A normal appendix is visualized. No colonic dilatation or wall thickening. Moderate colonic stool burden. No evidence of bowel obstruction. Vascular/Lymphatic: Atherosclerotic calcifications within the abdominal aorta and branch vessels. No aneurysm or ectasia. Multiple mid mesenteric prominent nodes are present though overall distribution and appearance is similar to 2012 comparison. No pathologically enlarged nodes are present in the abdomen or pelvis. Reproductive: The prostate and seminal vesicles are unremarkable. Other: No abdominopelvic free fluid or free gas. No bowel containing hernias. Small fat containing left inguinal hernia. Neurostimulator battery pack in the right flank soft tissues. Intact stimulator leads enter the canal at the T9-10 interspinous space and terminate at the T7-T9 levels. 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 Interbody fusions at the L5-S1 and S1-S2 levels without acute complication. No acute or worrisome osseous lesions. Minimal degenerative changes in the spine, hips and pelvis. IMPRESSION: 1. Multiple fluid-filled loops of small bowel are nonspecific, but can be seen with enteritis. No evidence of bowel obstruction. 2. Circumferential bladder wall  thickening, somewhat greater than expected for underdistention. Correlate with urinalysis to exclude cystitis. 3. Hepatic steatosis with areas of focal fatty sparing along the gallbladder fossa and in the right lobe periphery. 4. Small fat containing left inguinal hernia. 5. 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 Interbody fusions at the L5-S1 and S1-S2 levels without acute  complication. 6. Aortic Atherosclerosis (ICD10-I70.0). Electronically Signed   By: Kreg Shropshire M.D.   On: 02/21/2020 06:31    ED COURSE and MDM  Nursing notes, initial and subsequent vitals signs, including pulse oximetry, reviewed and interpreted by myself.  Vitals:   02/21/20 0409 02/21/20 0411 02/21/20 0455 02/21/20 0644  BP:  (!) 120/96 (!) 129/105 (!) 134/94  Pulse:  (!) 130 (!) 110 87  Resp:  16 16 14   Temp:  98.2 F (36.8 C) 98.2 F (36.8 C)   TempSrc:  Oral    SpO2:  98% 97% 98%  Weight: 98 kg     Height: 5\' 10"  (1.778 m)      Medications  sodium chloride 0.9 % bolus 1,000 mL (1,000 mLs Intravenous New Bag/Given 02/21/20 0531)  metoCLOPramide (REGLAN) injection 10 mg (10 mg Intravenous Given 02/21/20 0531)  iohexol (OMNIPAQUE) 300 MG/ML solution 100 mL (100 mLs Intravenous Contrast Given 02/21/20 0612)   6:47 AM Awaiting CT scan.  Signed out to morning EDP.   PROCEDURES  Procedures   ED DIAGNOSES     ICD-10-CM   1. Persistent recurrent vomiting  R11.15   2. Lower abdominal pain  R10.30        [place], 02/23/20, MD 02/21/20 [phone]

## 2020-02-21 NOTE — ED Provider Notes (Signed)
Patient CT scan of the abdomen showed no signs of obstruction.  Did have some air-fluid levels.  Patient without any vomiting since 1:30 in the morning.  Patient's labs including urinalysis without any significant abnormal findings.  Patient overall feeling improved.  He feels stable for discharge home and follow-up with his doctors.  She has Reglan at home.   Vanetta Mulders, MD 02/21/20 1039

## 2020-03-20 DIAGNOSIS — K900 Celiac disease: Secondary | ICD-10-CM | POA: Diagnosis not present

## 2020-04-07 DIAGNOSIS — K900 Celiac disease: Secondary | ICD-10-CM | POA: Diagnosis not present

## 2020-05-09 DIAGNOSIS — K6389 Other specified diseases of intestine: Secondary | ICD-10-CM | POA: Diagnosis not present

## 2020-05-09 DIAGNOSIS — K900 Celiac disease: Secondary | ICD-10-CM | POA: Diagnosis not present

## 2020-05-09 DIAGNOSIS — R194 Change in bowel habit: Secondary | ICD-10-CM | POA: Diagnosis not present

## 2020-05-09 DIAGNOSIS — K5900 Constipation, unspecified: Secondary | ICD-10-CM | POA: Diagnosis not present

## 2020-05-09 DIAGNOSIS — R112 Nausea with vomiting, unspecified: Secondary | ICD-10-CM | POA: Diagnosis not present

## 2020-05-09 DIAGNOSIS — R1084 Generalized abdominal pain: Secondary | ICD-10-CM | POA: Diagnosis not present

## 2020-05-09 DIAGNOSIS — K921 Melena: Secondary | ICD-10-CM | POA: Diagnosis not present

## 2020-05-13 DIAGNOSIS — K635 Polyp of colon: Secondary | ICD-10-CM | POA: Diagnosis not present

## 2020-05-13 DIAGNOSIS — D125 Benign neoplasm of sigmoid colon: Secondary | ICD-10-CM | POA: Diagnosis not present

## 2020-05-13 DIAGNOSIS — K921 Melena: Secondary | ICD-10-CM | POA: Diagnosis not present

## 2020-05-13 DIAGNOSIS — Z538 Procedure and treatment not carried out for other reasons: Secondary | ICD-10-CM | POA: Diagnosis not present

## 2020-05-13 DIAGNOSIS — K648 Other hemorrhoids: Secondary | ICD-10-CM | POA: Diagnosis not present

## 2020-07-24 DIAGNOSIS — Z20822 Contact with and (suspected) exposure to covid-19: Secondary | ICD-10-CM | POA: Diagnosis not present

## 2021-01-28 DIAGNOSIS — K900 Celiac disease: Secondary | ICD-10-CM | POA: Diagnosis not present

## 2021-01-28 DIAGNOSIS — R112 Nausea with vomiting, unspecified: Secondary | ICD-10-CM | POA: Diagnosis not present

## 2021-01-28 DIAGNOSIS — R109 Unspecified abdominal pain: Secondary | ICD-10-CM | POA: Diagnosis not present

## 2021-01-28 DIAGNOSIS — R194 Change in bowel habit: Secondary | ICD-10-CM | POA: Diagnosis not present

## 2021-01-30 DIAGNOSIS — K900 Celiac disease: Secondary | ICD-10-CM | POA: Diagnosis not present

## 2021-01-30 DIAGNOSIS — R109 Unspecified abdominal pain: Secondary | ICD-10-CM | POA: Diagnosis not present

## 2021-01-30 DIAGNOSIS — R112 Nausea with vomiting, unspecified: Secondary | ICD-10-CM | POA: Diagnosis not present

## 2021-01-30 DIAGNOSIS — R197 Diarrhea, unspecified: Secondary | ICD-10-CM | POA: Diagnosis not present

## 2021-02-07 DIAGNOSIS — R197 Diarrhea, unspecified: Secondary | ICD-10-CM | POA: Diagnosis not present

## 2021-02-07 DIAGNOSIS — R109 Unspecified abdominal pain: Secondary | ICD-10-CM | POA: Diagnosis not present

## 2021-02-07 DIAGNOSIS — K900 Celiac disease: Secondary | ICD-10-CM | POA: Diagnosis not present

## 2021-02-07 DIAGNOSIS — R112 Nausea with vomiting, unspecified: Secondary | ICD-10-CM | POA: Diagnosis not present

## 2021-06-10 DIAGNOSIS — Z8719 Personal history of other diseases of the digestive system: Secondary | ICD-10-CM | POA: Diagnosis not present

## 2021-06-10 DIAGNOSIS — H9193 Unspecified hearing loss, bilateral: Secondary | ICD-10-CM | POA: Diagnosis not present

## 2021-07-09 DIAGNOSIS — U071 COVID-19: Secondary | ICD-10-CM | POA: Diagnosis not present

## 2021-07-21 DIAGNOSIS — K900 Celiac disease: Secondary | ICD-10-CM | POA: Diagnosis not present

## 2021-07-21 DIAGNOSIS — R112 Nausea with vomiting, unspecified: Secondary | ICD-10-CM | POA: Diagnosis not present

## 2021-08-19 DIAGNOSIS — K900 Celiac disease: Secondary | ICD-10-CM | POA: Diagnosis not present

## 2021-08-19 DIAGNOSIS — D131 Benign neoplasm of stomach: Secondary | ICD-10-CM | POA: Diagnosis not present

## 2021-08-19 DIAGNOSIS — K319 Disease of stomach and duodenum, unspecified: Secondary | ICD-10-CM | POA: Diagnosis not present

## 2021-08-19 DIAGNOSIS — K3189 Other diseases of stomach and duodenum: Secondary | ICD-10-CM | POA: Diagnosis not present

## 2021-08-19 DIAGNOSIS — K259 Gastric ulcer, unspecified as acute or chronic, without hemorrhage or perforation: Secondary | ICD-10-CM | POA: Diagnosis not present

## 2021-08-19 DIAGNOSIS — K317 Polyp of stomach and duodenum: Secondary | ICD-10-CM | POA: Diagnosis not present

## 2021-08-19 DIAGNOSIS — K2960 Other gastritis without bleeding: Secondary | ICD-10-CM | POA: Diagnosis not present

## 2021-08-31 DIAGNOSIS — K900 Celiac disease: Secondary | ICD-10-CM | POA: Diagnosis not present

## 2021-09-04 DIAGNOSIS — S63501A Unspecified sprain of right wrist, initial encounter: Secondary | ICD-10-CM | POA: Diagnosis not present

## 2021-09-29 DIAGNOSIS — K9089 Other intestinal malabsorption: Secondary | ICD-10-CM | POA: Diagnosis not present

## 2021-10-07 DIAGNOSIS — M545 Low back pain, unspecified: Secondary | ICD-10-CM | POA: Diagnosis not present

## 2021-10-16 DIAGNOSIS — M545 Low back pain, unspecified: Secondary | ICD-10-CM | POA: Diagnosis not present

## 2021-11-12 DIAGNOSIS — R319 Hematuria, unspecified: Secondary | ICD-10-CM | POA: Diagnosis not present

## 2021-11-12 DIAGNOSIS — N342 Other urethritis: Secondary | ICD-10-CM | POA: Diagnosis not present
